# Patient Record
Sex: Male | Born: 1945 | Race: White | Hispanic: No | Marital: Married | Smoking: Former smoker
Health system: Southern US, Community
[De-identification: ages and names within clinical notes are randomized; demographics above are authoritative.]

## PROBLEM LIST (undated history)

## (undated) DIAGNOSIS — I639 Cerebral infarction, unspecified: Secondary | ICD-10-CM

## (undated) DIAGNOSIS — E079 Disorder of thyroid, unspecified: Secondary | ICD-10-CM

## (undated) DIAGNOSIS — E785 Hyperlipidemia, unspecified: Secondary | ICD-10-CM

## (undated) DIAGNOSIS — J439 Emphysema, unspecified: Secondary | ICD-10-CM

## (undated) DIAGNOSIS — I10 Essential (primary) hypertension: Secondary | ICD-10-CM

## (undated) HISTORY — DX: Cerebral infarction, unspecified: I63.9

## (undated) HISTORY — DX: Hyperlipidemia, unspecified: E78.5

## (undated) HISTORY — PX: CAROTID ENDARTERECTOMY: SUR193

---

## 2018-10-27 ENCOUNTER — Emergency Department (HOSPITAL_BASED_OUTPATIENT_CLINIC_OR_DEPARTMENT_OTHER): Payer: Medicare Other

## 2018-10-27 ENCOUNTER — Encounter (HOSPITAL_BASED_OUTPATIENT_CLINIC_OR_DEPARTMENT_OTHER): Payer: Self-pay | Admitting: Emergency Medicine

## 2018-10-27 ENCOUNTER — Emergency Department (HOSPITAL_BASED_OUTPATIENT_CLINIC_OR_DEPARTMENT_OTHER)
Admission: EM | Admit: 2018-10-27 | Discharge: 2018-10-27 | Disposition: A | Discharge status: Home / Self Care | Payer: Medicare Other | Attending: Emergency Medicine | Admitting: Emergency Medicine

## 2018-10-27 ENCOUNTER — Other Ambulatory Visit: Payer: Self-pay

## 2018-10-27 DIAGNOSIS — J439 Emphysema, unspecified: Secondary | ICD-10-CM | POA: Insufficient documentation

## 2018-10-27 DIAGNOSIS — Z87891 Personal history of nicotine dependence: Secondary | ICD-10-CM | POA: Insufficient documentation

## 2018-10-27 DIAGNOSIS — I10 Essential (primary) hypertension: Secondary | ICD-10-CM | POA: Insufficient documentation

## 2018-10-27 DIAGNOSIS — R0602 Shortness of breath: Secondary | ICD-10-CM | POA: Diagnosis present

## 2018-10-27 DIAGNOSIS — R05 Cough: Secondary | ICD-10-CM | POA: Insufficient documentation

## 2018-10-27 DIAGNOSIS — Z79899 Other long term (current) drug therapy: Secondary | ICD-10-CM | POA: Diagnosis not present

## 2018-10-27 DIAGNOSIS — R059 Cough, unspecified: Secondary | ICD-10-CM

## 2018-10-27 HISTORY — DX: Disorder of thyroid, unspecified: E07.9

## 2018-10-27 HISTORY — DX: Emphysema, unspecified: J43.9

## 2018-10-27 HISTORY — DX: Essential (primary) hypertension: I10

## 2018-10-27 LAB — COMPREHENSIVE METABOLIC PANEL
ALT: 32 U/L (ref 0–44)
AST: 47 U/L — ABNORMAL HIGH (ref 15–41)
Albumin: 3.9 g/dL (ref 3.5–5.0)
Alkaline Phosphatase: 68 U/L (ref 38–126)
Anion gap: 9 (ref 5–15)
BUN: 18 mg/dL (ref 8–23)
CO2: 26 mmol/L (ref 22–32)
Calcium: 8 mg/dL — ABNORMAL LOW (ref 8.9–10.3)
Chloride: 100 mmol/L (ref 98–111)
Creatinine, Ser: 1.37 mg/dL — ABNORMAL HIGH (ref 0.61–1.24)
GFR calc Af Amer: 59 mL/min — ABNORMAL LOW (ref >60)
GFR calc non Af Amer: 51 mL/min — ABNORMAL LOW (ref >60)
Glucose, Bld: 124 mg/dL — ABNORMAL HIGH (ref 70–99)
Potassium: 4.2 mmol/L (ref 3.5–5.1)
Sodium: 135 mmol/L (ref 135–145)
Total Bilirubin: 0.6 mg/dL (ref 0.3–1.2)
Total Protein: 7.2 g/dL (ref 6.5–8.1)

## 2018-10-27 LAB — CBC
HCT: 40.9 % (ref 39.0–52.0)
Hemoglobin: 13.6 g/dL (ref 13.0–17.0)
MCH: 32.4 pg (ref 26.0–34.0)
MCHC: 33.3 g/dL (ref 30.0–36.0)
MCV: 97.4 fL (ref 80.0–100.0)
Platelets: 138 10*3/uL — ABNORMAL LOW (ref 150–400)
RBC: 4.2 MIL/uL — ABNORMAL LOW (ref 4.22–5.81)
RDW: 11.9 % (ref 11.5–15.5)
WBC: 5 10*3/uL (ref 4.0–10.5)
nRBC: 0 % (ref 0.0–0.2)

## 2018-10-27 LAB — TROPONIN I: Troponin I: <0.03 ng/mL (ref <0.03)

## 2018-10-27 MED ORDER — ALBUTEROL SULFATE HFA 108 (90 BASE) MCG/ACT IN AERS
2.0000 | INHALATION_SPRAY | RESPIRATORY_TRACT | Status: DC | PRN
  Administered 2018-10-27: 10:00:00 2 via RESPIRATORY_TRACT

## 2018-10-27 MED ORDER — ALBUTEROL SULFATE HFA 108 (90 BASE) MCG/ACT IN AERS
INHALATION_SPRAY | RESPIRATORY_TRACT | Status: AC
  Administered 2018-10-27: 10:00:00 2 via RESPIRATORY_TRACT
  Filled 2018-10-27: qty 6.7

## 2018-10-27 MED ORDER — SODIUM CHLORIDE 0.9 % IV BOLUS
500.0000 mL | Freq: Once | INTRAVENOUS | Status: AC
  Administered 2018-10-27: 500 mL via INTRAVENOUS

## 2018-10-27 NOTE — ED Triage Notes (Signed)
Per EMS patient got off cruise ship on Monday.  Began having cough on ship.  Febrile while on cruise.  Had evisit and given amoxicillin without relief in cough.  Increased shortness of breath as well as diarrhea.  Patient from home.

## 2018-10-27 NOTE — ED Provider Notes (Signed)
[facility] EMERGENCY DEPARTMENT Provider Note   CSN: [id number] Arrival date & time: 10/27/18  0719    History   Chief Complaint Chief Complaint  Patient presents with  . Shortness of Breath    HPI Alexander Stewart is a 73 y.o. male.     HPI Patient is a 73 year old male who recently came off a cruise ship on October 23, 2018.  At that point he been having cough for 5 days and since returning to the [place] has been self quarantining at home.  He has not been tested for COVID-19.  He reportedly has been having some worsening cough over the past week and was started on Augmentin after an ED visit 2 days ago.  He has had 2 days of Augmentin and his wife reports that his cough is significantly improving.  This morning his wife called EMS from the patient sat up in bed and began coughing and then had a syncopal episode.  She thinks he may have struck his head.  He is not on anticoagulants.  She reports he was slow to recover.  Currently patient is without headache or head injury.  No neck pain.  He is alert and oriented x3.  He has no significant complaints.  He denies significant shortness of breath.  He reports persistence of his cough.  He is unsure if he thinks his cough has improved over the past 2 days.  Patient reports no increasing shortness of breath at time of my evaluation.  No history of asthma.  He is on losartan.  He had decreased oral intake yesterday per his wife.  Patient reports no fevers at home.  Wife reports that he has had some subjective fevers at home.  Patient reports no use of inhalers at home.  Patient denies a history of emphysema and COPD however a diagnosis of emphysema lives in his chart.  No complaints of neck pain no weakness of his arms or legs.  Denies nausea vomiting.  No chest pain.  No abdominal pain.   Past Medical History:  Diagnosis Date  . Emphysema lung (HCC)   . Hypertension   . Thyroid disease     There are no active problems to  display for this patient.   History reviewed. No pertinent surgical history.      Home Medications    Prior to Admission medications   Medication Sig Start Date End Date Taking? Authorizing Provider  levothyroxine (SYNTHROID, LEVOTHROID) 25 MCG tablet Take 25 mcg by mouth daily before breakfast.   Yes [provider]  losartan (COZAAR) 25 MG tablet Take 25 mg by mouth daily.   Yes [provider]    Family History History reviewed. No pertinent family history.  Social History Social History   Tobacco Use  . Smoking status: Former Games developermoker  . Smokeless tobacco: Never Used  Substance Use Topics  . Alcohol use: Yes  . Drug use: Never     Allergies   Patient has no known allergies.   Review of Systems Review of Systems  All other systems reviewed and are negative.    Physical Exam Updated Vital Signs BP 123/82   Pulse 80   Temp 99.3 F (37.4 C) (Oral)   Resp (!) 21   Ht 5' 7.5" (1.715 m)   Wt 74.8 kg   SpO2 97%   BMI 25.46 kg/m   Physical Exam Vitals signs and nursing note reviewed.  Constitutional:      Appearance: He  is well-developed.  HENT:     Head: Normocephalic.  Neck:     Musculoskeletal: Normal range of motion.     Comments: Full range of motion of neck without limitations Cardiovascular:     Rate and Rhythm: Normal rate.  Pulmonary:     Effort: Pulmonary effort is normal.  Abdominal:     General: There is no distension.  Musculoskeletal: Normal range of motion.     Comments: Moves all 4 extremities equally  Neurological:     Mental Status: He is alert and oriented to person, place, and time.      ED Treatments / Results  Labs (all labs ordered are listed, but only abnormal results are displayed) Labs Reviewed  CBC - Abnormal; Notable for the following components:      Result Value   RBC 4.20 (*)    Platelets 138 (*)    All other components within normal limits  COMPREHENSIVE METABOLIC PANEL - Abnormal; Notable  for the following components:   Glucose, Bld 124 (*)    Creatinine, Ser 1.37 (*)    Calcium 8.0 (*)    AST 47 (*)    GFR calc non Af Amer 51 (*)    GFR calc Af Amer 59 (*)    All other components within normal limits  TROPONIN I    EKG EKG Interpretation  Date/Time:  Friday October 27 2018 07:41:26 EDT Ventricular Rate:  73 PR Interval:    QRS Duration: 62 QT Interval:  338 QTC Calculation: 373 R Axis:   69 Text Interpretation:  Sinus rhythm No old tracing to compare Confirmed by Azalia Bilis ([id number]) on 10/27/2018 9:27:33 AM   Radiology Dg Chest Portable 1 View  Result Date: 10/27/2018 CLINICAL DATA:  Cough and shortness of breath. Returned from a cruise on Monday. EXAM: PORTABLE CHEST 1 VIEW COMPARISON:  None. FINDINGS: The heart size and mediastinal contours are within normal limits. Loop recorder in place. Atherosclerotic calcification of the aortic arch. Normal pulmonary vascularity. No focal consolidation, pleural effusion, or pneumothorax. No acute osseous abnormality. IMPRESSION: 1. No active disease. 2.  Aortic atherosclerosis (ICD10-I70.0). Electronically Signed   By: Obie Dredge M.D.   On: 10/27/2018 08:26    Procedures Procedures (including critical care time)  Medications Ordered in ED Medications  albuterol (PROVENTIL HFA;VENTOLIN HFA) 108 (90 Base) MCG/ACT inhaler 2 puff (has no administration in time range)  sodium chloride 0.9 % bolus 500 mL (has no administration in time range)     Initial Impression / Assessment and Plan / ED Course  I have reviewed the triage vital signs and the nursing notes.  Pertinent labs & imaging results that were available during my care of the patient were reviewed by me and considered in my medical decision making (see chart for details).        Definitely the possibility of COVID-19.  His chest x-ray is clear.  Oral temperature of 99.3.  No hypoxia or increased work of breathing here in the emergency department.  Patient  will need to continue to self quarantine at home.  Suspect mild dehydration.  500 cc bolus of fluid given.  He has been on Augmentin for 2 days and sounds like he is improving from a coughing standpoint.  Patient will be discharged home with an albuterol inhaler for cough.  I do not think he needs additional work-up or evaluation at this time.  He complains of no headache or head injury.  He has no neck pain  or neck stiffness.  He has full range of motion of his neck.  He is not on anticoagulants.  I do not think he needs CT imaging of his head at this time.  Head injury warnings are given.  Syncope in the setting of cough likely cough related syncope.  Dead doubt arrhythmia.  No palpitations.  No chest pain.    Final Clinical Impressions(s) / ED Diagnoses   Final diagnoses:  None    ED Discharge Orders    None       Azalia Bilis, MD 10/27/18 [phone]

## 2018-10-27 NOTE — Discharge Instructions (Addendum)
Use the albuterol inhaler every 4 hours as needed for cough  Continue the use of the antibiotics prescribed by your provider (Augmentin)  Return to the emergency department for any new or worsening symptoms  Please continue to self quarantine at home until you are fever free without the use of fever reducing medications for 72 hours and for greater than 7 days after initial onset of symptoms

## 2018-10-27 NOTE — ED Triage Notes (Addendum)
Patient reports cough x 1 week.  Reports seen with evisit and self quarantined at home. Taking augmentin x 2 days. Increased shortness of breath. Reports diarrhea after beginning abx.  Ambulatory to room in NAD.

## 2019-09-09 ENCOUNTER — Ambulatory Visit: Payer: Medicare Other

## 2020-10-15 IMAGING — DX PORTABLE CHEST - 1 VIEW
1 series · 1 of 1 positions shown · non-contrast
Comparison: None.

CLINICAL DATA: Cough and shortness of breath. Returned from [place] on [REDACTED].

EXAM:
PORTABLE CHEST 1 VIEW

[chest ap]
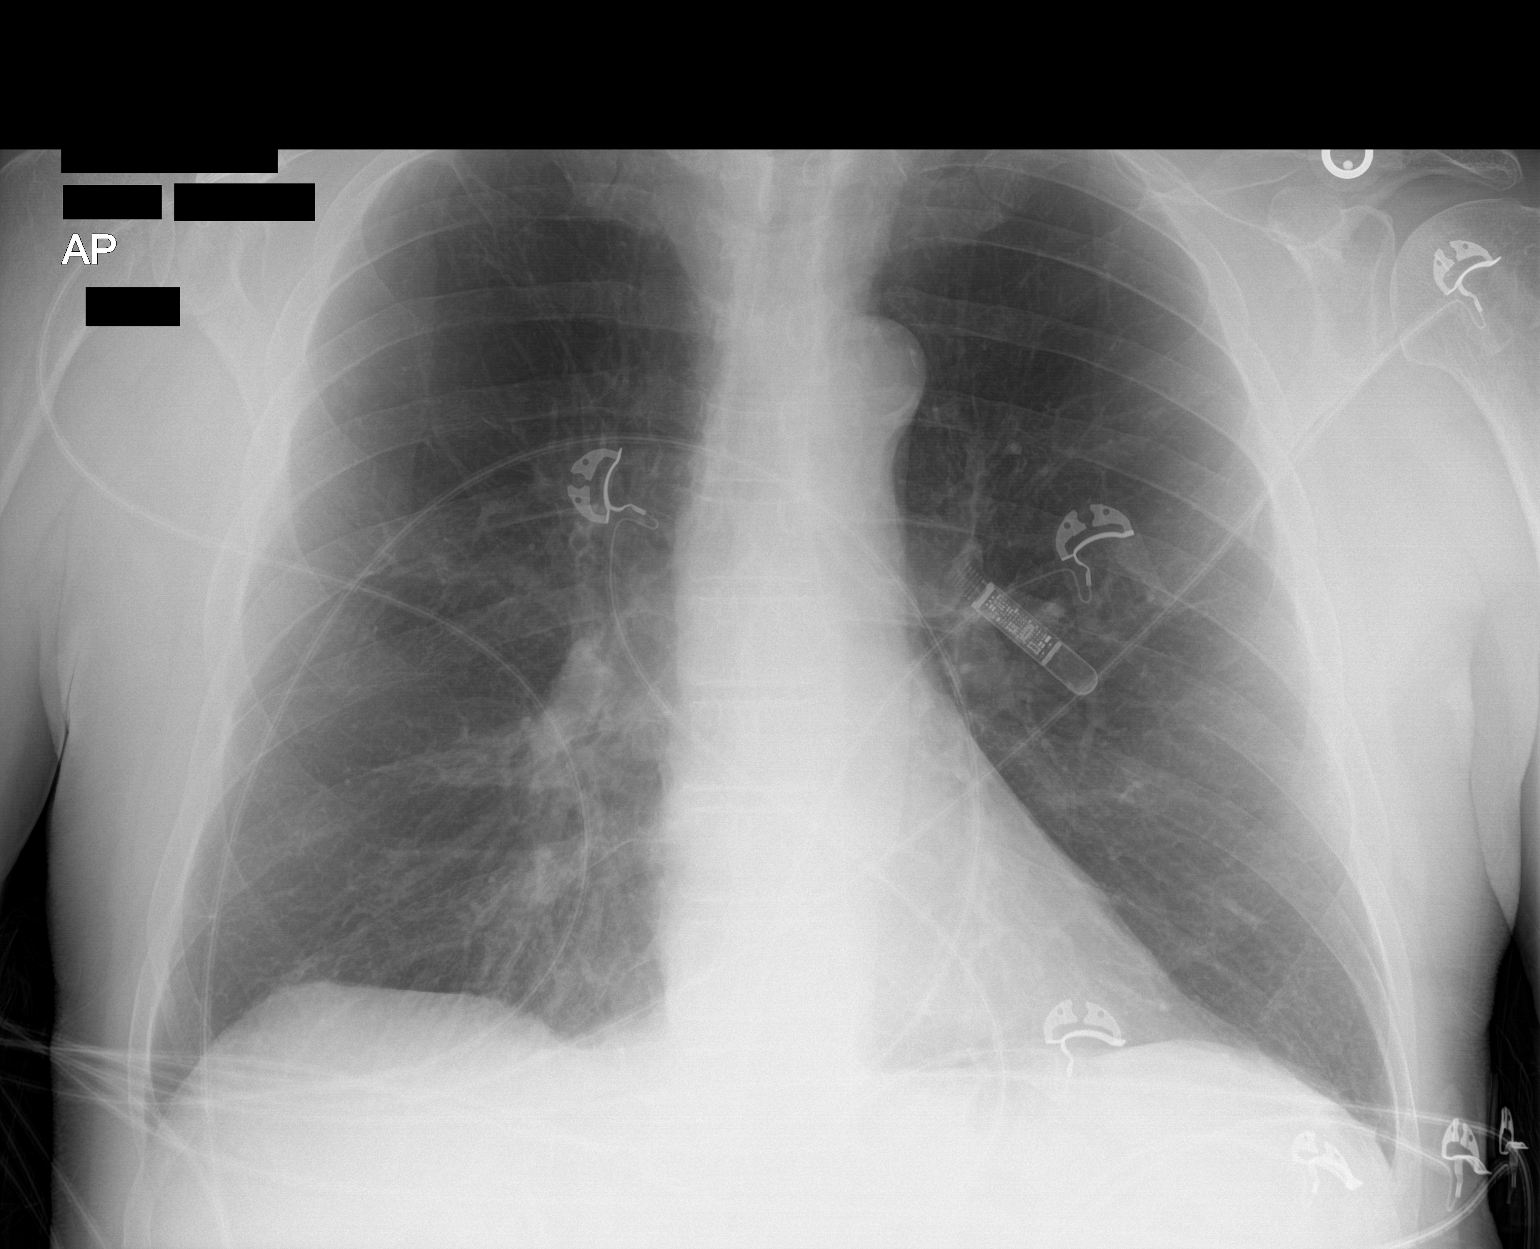

[1 of 1 positions shown; findings below may reference images not displayed]

FINDINGS: The heart size and mediastinal contours are within normal limits.
Loop recorder in place. Atherosclerotic calcification of the aortic
arch. Normal pulmonary vascularity. No focal consolidation, pleural
effusion, or pneumothorax. No acute osseous abnormality.
IMPRESSION: 1. No active disease.
2.  Aortic atherosclerosis ([id number]).

## 2022-04-12 ENCOUNTER — Emergency Department (HOSPITAL_COMMUNITY): Payer: Medicare Other

## 2022-04-12 ENCOUNTER — Encounter (HOSPITAL_COMMUNITY): Payer: Self-pay

## 2022-04-12 ENCOUNTER — Emergency Department (HOSPITAL_COMMUNITY): Payer: Medicare Other | Admitting: Certified Registered Nurse Anesthetist

## 2022-04-12 ENCOUNTER — Inpatient Hospital Stay (HOSPITAL_COMMUNITY)
Admission: EM | Admit: 2022-04-12 | Discharge: 2022-04-14 | DRG: 024 | Disposition: A | Discharge status: Home / Self Care | Payer: Medicare Other | Attending: Neurology | Admitting: Neurology

## 2022-04-12 ENCOUNTER — Encounter (HOSPITAL_COMMUNITY)
Admission: EM | Disposition: A | Discharge status: Home / Self Care | Payer: Self-pay | Source: Home / Self Care | Attending: Neurology

## 2022-04-12 ENCOUNTER — Inpatient Hospital Stay (HOSPITAL_COMMUNITY): Payer: Medicare Other

## 2022-04-12 ENCOUNTER — Other Ambulatory Visit: Payer: Self-pay

## 2022-04-12 DIAGNOSIS — E7800 Pure hypercholesterolemia, unspecified: Secondary | ICD-10-CM | POA: Diagnosis not present

## 2022-04-12 DIAGNOSIS — J449 Chronic obstructive pulmonary disease, unspecified: Secondary | ICD-10-CM

## 2022-04-12 DIAGNOSIS — I10 Essential (primary) hypertension: Secondary | ICD-10-CM

## 2022-04-12 DIAGNOSIS — I63511 Cerebral infarction due to unspecified occlusion or stenosis of right middle cerebral artery: Principal | ICD-10-CM | POA: Diagnosis present

## 2022-04-12 DIAGNOSIS — Y838 Other surgical procedures as the cause of abnormal reaction of the patient, or of later complication, without mention of misadventure at the time of the procedure: Secondary | ICD-10-CM | POA: Diagnosis not present

## 2022-04-12 DIAGNOSIS — E039 Hypothyroidism, unspecified: Secondary | ICD-10-CM | POA: Diagnosis present

## 2022-04-12 DIAGNOSIS — I63231 Cerebral infarction due to unspecified occlusion or stenosis of right carotid arteries: Secondary | ICD-10-CM

## 2022-04-12 DIAGNOSIS — G8194 Hemiplegia, unspecified affecting left nondominant side: Secondary | ICD-10-CM | POA: Diagnosis present

## 2022-04-12 DIAGNOSIS — R29710 NIHSS score 10: Secondary | ICD-10-CM | POA: Diagnosis present

## 2022-04-12 DIAGNOSIS — R251 Tremor, unspecified: Secondary | ICD-10-CM | POA: Diagnosis not present

## 2022-04-12 DIAGNOSIS — J439 Emphysema, unspecified: Secondary | ICD-10-CM | POA: Diagnosis present

## 2022-04-12 DIAGNOSIS — I6340 Cerebral infarction due to embolism of unspecified cerebral artery: Secondary | ICD-10-CM | POA: Diagnosis not present

## 2022-04-12 DIAGNOSIS — Z9181 History of falling: Secondary | ICD-10-CM | POA: Diagnosis not present

## 2022-04-12 DIAGNOSIS — I700 Atherosclerosis of aorta: Secondary | ICD-10-CM | POA: Diagnosis present

## 2022-04-12 DIAGNOSIS — G9751 Postprocedural hemorrhage and hematoma of a nervous system organ or structure following a nervous system procedure: Secondary | ICD-10-CM | POA: Diagnosis not present

## 2022-04-12 DIAGNOSIS — H5340 Unspecified visual field defects: Secondary | ICD-10-CM | POA: Diagnosis present

## 2022-04-12 DIAGNOSIS — Z87891 Personal history of nicotine dependence: Secondary | ICD-10-CM | POA: Diagnosis not present

## 2022-04-12 DIAGNOSIS — R29810 Facial weakness: Secondary | ICD-10-CM | POA: Diagnosis present

## 2022-04-12 DIAGNOSIS — Z8673 Personal history of transient ischemic attack (TIA), and cerebral infarction without residual deficits: Secondary | ICD-10-CM | POA: Diagnosis not present

## 2022-04-12 DIAGNOSIS — E785 Hyperlipidemia, unspecified: Secondary | ICD-10-CM | POA: Diagnosis present

## 2022-04-12 DIAGNOSIS — E079 Disorder of thyroid, unspecified: Secondary | ICD-10-CM | POA: Diagnosis present

## 2022-04-12 DIAGNOSIS — I639 Cerebral infarction, unspecified: Secondary | ICD-10-CM | POA: Diagnosis present

## 2022-04-12 DIAGNOSIS — I6521 Occlusion and stenosis of right carotid artery: Secondary | ICD-10-CM | POA: Diagnosis present

## 2022-04-12 DIAGNOSIS — I6389 Other cerebral infarction: Secondary | ICD-10-CM

## 2022-04-12 DIAGNOSIS — Z8679 Personal history of other diseases of the circulatory system: Secondary | ICD-10-CM | POA: Diagnosis not present

## 2022-04-12 HISTORY — PX: RADIOLOGY WITH ANESTHESIA: SHX6223

## 2022-04-12 HISTORY — PX: IR PERCUTANEOUS ART THROMBECTOMY/INFUSION INTRACRANIAL INC DIAG ANGIO: IMG6087

## 2022-04-12 HISTORY — PX: IR INTRA CRAN STENT: IMG2345

## 2022-04-12 HISTORY — PX: IR CT HEAD LTD: IMG2386

## 2022-04-12 HISTORY — PX: IR US GUIDE VASC ACCESS RIGHT: IMG2390

## 2022-04-12 HISTORY — PX: IR INTRAVSC STENT CERV CAROTID W/EMB-PROT MOD SED INCL ANGIO: IMG2303

## 2022-04-12 LAB — CBC
HCT: 43.1 % (ref 39.0–52.0)
Hemoglobin: 15.3 g/dL (ref 13.0–17.0)
MCH: 33.9 pg (ref 26.0–34.0)
MCHC: 35.5 g/dL (ref 30.0–36.0)
MCV: 95.6 fL (ref 80.0–100.0)
Platelets: 200 10*3/uL (ref 150–400)
RBC: 4.51 MIL/uL (ref 4.22–5.81)
RDW: 11.8 % (ref 11.5–15.5)
WBC: 6.1 10*3/uL (ref 4.0–10.5)
nRBC: 0 % (ref 0.0–0.2)

## 2022-04-12 LAB — I-STAT CHEM 8, ED
BUN: 11 mg/dL (ref 8–23)
Calcium, Ion: 1.04 mmol/L — ABNORMAL LOW (ref 1.15–1.40)
Chloride: 105 mmol/L (ref 98–111)
Creatinine, Ser: 1 mg/dL (ref 0.61–1.24)
Glucose, Bld: 174 mg/dL — ABNORMAL HIGH (ref 70–99)
HCT: 43 % (ref 39.0–52.0)
Hemoglobin: 14.6 g/dL (ref 13.0–17.0)
Potassium: 4 mmol/L (ref 3.5–5.1)
Sodium: 139 mmol/L (ref 135–145)
TCO2: 23 mmol/L (ref 22–32)

## 2022-04-12 LAB — ECHOCARDIOGRAM COMPLETE
AR max vel: 2.74 cm2
AV Area VTI: 2.43 cm2
AV Area mean vel: 2.25 cm2
AV Mean grad: 3 mmHg
AV Peak grad: 4.2 mmHg
Ao pk vel: 1.03 m/s
Area-P 1/2: 3.77 cm2
Calc EF: 60.9 %
Height: 67.5 in
S' Lateral: 2.3 cm
Single Plane A2C EF: 59.2 %
Single Plane A4C EF: 64.5 %
Weight: 2560.86 oz

## 2022-04-12 LAB — DIFFERENTIAL
Abs Immature Granulocytes: 0.01 10*3/uL (ref 0.00–0.07)
Basophils Absolute: 0.1 10*3/uL (ref 0.0–0.1)
Basophils Relative: 1 %
Eosinophils Absolute: 0 10*3/uL (ref 0.0–0.5)
Eosinophils Relative: 1 %
Immature Granulocytes: 0 %
Lymphocytes Relative: 41 %
Lymphs Abs: 2.5 10*3/uL (ref 0.7–4.0)
Monocytes Absolute: 0.5 10*3/uL (ref 0.1–1.0)
Monocytes Relative: 8 %
Neutro Abs: 3 10*3/uL (ref 1.7–7.7)
Neutrophils Relative %: 49 %

## 2022-04-12 LAB — COMPREHENSIVE METABOLIC PANEL
ALT: 23 U/L (ref 0–44)
AST: 29 U/L (ref 15–41)
Albumin: 3.9 g/dL (ref 3.5–5.0)
Alkaline Phosphatase: 69 U/L (ref 38–126)
Anion gap: 10 (ref 5–15)
BUN: 12 mg/dL (ref 8–23)
CO2: 25 mmol/L (ref 22–32)
Calcium: 9.2 mg/dL (ref 8.9–10.3)
Chloride: 105 mmol/L (ref 98–111)
Creatinine, Ser: 1.22 mg/dL (ref 0.61–1.24)
GFR, Estimated: >60 mL/min (ref >60)
Glucose, Bld: 174 mg/dL — ABNORMAL HIGH (ref 70–99)
Potassium: 4.2 mmol/L (ref 3.5–5.1)
Sodium: 140 mmol/L (ref 135–145)
Total Bilirubin: 0.8 mg/dL (ref 0.3–1.2)
Total Protein: 6.9 g/dL (ref 6.5–8.1)

## 2022-04-12 LAB — TSH: TSH: 10.849 u[IU]/mL — ABNORMAL HIGH (ref 0.350–4.500)

## 2022-04-12 LAB — HEMOGLOBIN A1C
Hgb A1c MFr Bld: 5.6 % (ref 4.8–5.6)
Mean Plasma Glucose: 114.02 mg/dL

## 2022-04-12 LAB — CBG MONITORING, ED: Glucose-Capillary: 140 mg/dL — ABNORMAL HIGH (ref 70–99)

## 2022-04-12 LAB — ETHANOL: Alcohol, Ethyl (B): <10 mg/dL (ref <10)

## 2022-04-12 LAB — MRSA NEXT GEN BY PCR, NASAL: MRSA by PCR Next Gen: NOT DETECTED

## 2022-04-12 LAB — T4, FREE: Free T4: 0.94 ng/dL (ref 0.61–1.12)

## 2022-04-12 LAB — APTT: aPTT: 28 seconds (ref 24–36)

## 2022-04-12 LAB — PROTIME-INR
INR: 1 (ref 0.8–1.2)
Prothrombin Time: 13.5 seconds (ref 11.4–15.2)

## 2022-04-12 SURGERY — IR WITH ANESTHESIA
Anesthesia: General

## 2022-04-12 MED ORDER — ACETAMINOPHEN 325 MG PO TABS: 650.0000 mg | ORAL_TABLET | ORAL | Status: DC | PRN

## 2022-04-12 MED ORDER — ACETAMINOPHEN 650 MG RE SUPP: 650.0000 mg | RECTAL | Status: DC | PRN

## 2022-04-12 MED ORDER — EPHEDRINE SULFATE-NACL 50-0.9 MG/10ML-% IV SOSY
PREFILLED_SYRINGE | INTRAVENOUS | Status: DC | PRN
  Administered 2022-04-12 (×2): 5 mg via INTRAVENOUS

## 2022-04-12 MED ORDER — GLYCOPYRROLATE PF 0.2 MG/ML IJ SOSY
PREFILLED_SYRINGE | INTRAMUSCULAR | Status: DC | PRN
  Administered 2022-04-12: .2 mg via INTRAVENOUS

## 2022-04-12 MED ORDER — FENTANYL CITRATE (PF) 100 MCG/2ML IJ SOLN
INTRAMUSCULAR | Status: AC
  Filled 2022-04-12: qty 2

## 2022-04-12 MED ORDER — LEVOTHYROXINE SODIUM 25 MCG PO TABS
25.0000 ug | ORAL_TABLET | Freq: Every day | ORAL | Status: DC
  Administered 2022-04-13 – 2022-04-14 (×2): 25 ug via ORAL
  Filled 2022-04-12 (×2): qty 1

## 2022-04-12 MED ORDER — CANGRELOR TETRASODIUM 50 MG IV SOLR
INTRAVENOUS | Status: AC
  Filled 2022-04-12: qty 50

## 2022-04-12 MED ORDER — SUGAMMADEX SODIUM 200 MG/2ML IV SOLN
INTRAVENOUS | Status: DC | PRN
  Administered 2022-04-12: 400 mg via INTRAVENOUS

## 2022-04-12 MED ORDER — LIDOCAINE 2% (20 MG/ML) 5 ML SYRINGE
INTRAMUSCULAR | Status: DC | PRN
  Administered 2022-04-12: 80 mg via INTRAVENOUS

## 2022-04-12 MED ORDER — IOHEXOL 300 MG/ML  SOLN
100.0000 mL | Freq: Once | INTRAMUSCULAR | Status: AC | PRN
  Administered 2022-04-12: 50 mL via INTRA_ARTERIAL

## 2022-04-12 MED ORDER — LOSARTAN POTASSIUM 50 MG PO TABS: 25.0000 mg | ORAL_TABLET | Freq: Every day | ORAL | Status: DC

## 2022-04-12 MED ORDER — ORAL CARE MOUTH RINSE: 15.0000 mL | OROMUCOSAL | Status: DC | PRN

## 2022-04-12 MED ORDER — CLEVIDIPINE BUTYRATE 0.5 MG/ML IV EMUL: 0.0000 mg/h | INTRAVENOUS | Status: DC

## 2022-04-12 MED ORDER — SODIUM CHLORIDE 0.9% FLUSH: 3.0000 mL | Freq: Once | INTRAVENOUS | Status: DC

## 2022-04-12 MED ORDER — FENTANYL CITRATE (PF) 250 MCG/5ML IJ SOLN
INTRAMUSCULAR | Status: DC | PRN
  Administered 2022-04-12 (×2): 25 ug via INTRAVENOUS
  Administered 2022-04-12: 50 ug via INTRAVENOUS

## 2022-04-12 MED ORDER — CHLORHEXIDINE GLUCONATE CLOTH 2 % EX PADS
6.0000 | MEDICATED_PAD | Freq: Every day | CUTANEOUS | Status: DC
  Administered 2022-04-12 – 2022-04-13 (×2): 6 via TOPICAL

## 2022-04-12 MED ORDER — PHENYLEPHRINE 80 MCG/ML (10ML) SYRINGE FOR IV PUSH (FOR BLOOD PRESSURE SUPPORT)
PREFILLED_SYRINGE | INTRAVENOUS | Status: DC | PRN
  Administered 2022-04-12 (×2): 80 ug via INTRAVENOUS

## 2022-04-12 MED ORDER — ROCURONIUM BROMIDE 10 MG/ML (PF) SYRINGE
PREFILLED_SYRINGE | INTRAVENOUS | Status: DC | PRN
  Administered 2022-04-12: 50 mg via INTRAVENOUS
  Administered 2022-04-12 (×2): 10 mg via INTRAVENOUS

## 2022-04-12 MED ORDER — PHENYLEPHRINE HCL-NACL 20-0.9 MG/250ML-% IV SOLN
INTRAVENOUS | Status: DC | PRN
  Administered 2022-04-12: 20 ug/min via INTRAVENOUS

## 2022-04-12 MED ORDER — IOHEXOL 300 MG/ML  SOLN
100.0000 mL | Freq: Once | INTRAMUSCULAR | Status: AC | PRN
  Administered 2022-04-12: 75 mL via INTRA_ARTERIAL

## 2022-04-12 MED ORDER — DEXAMETHASONE SODIUM PHOSPHATE 10 MG/ML IJ SOLN
INTRAMUSCULAR | Status: DC | PRN
  Administered 2022-04-12: 5 mg via INTRAVENOUS

## 2022-04-12 MED ORDER — STROKE: EARLY STAGES OF RECOVERY BOOK
Freq: Once | Status: AC
  Filled 2022-04-12: qty 1

## 2022-04-12 MED ORDER — CANGRELOR BOLUS VIA INFUSION
30.0000 ug/kg | Freq: Once | INTRAVENOUS | Status: AC
  Administered 2022-04-12: 2178 ug via INTRAVENOUS

## 2022-04-12 MED ORDER — SENNOSIDES-DOCUSATE SODIUM 8.6-50 MG PO TABS: 1.0000 | ORAL_TABLET | Freq: Every evening | ORAL | Status: DC | PRN

## 2022-04-12 MED ORDER — ASPIRIN 81 MG PO TBEC
81.0000 mg | DELAYED_RELEASE_TABLET | Freq: Once | ORAL | Status: AC
  Administered 2022-04-12: 81 mg via ORAL
  Filled 2022-04-12: qty 1

## 2022-04-12 MED ORDER — CLEVIDIPINE BUTYRATE 0.5 MG/ML IV EMUL
0.0000 mg/h | INTRAVENOUS | Status: DC
  Filled 2022-04-12: qty 50

## 2022-04-12 MED ORDER — TICAGRELOR 90 MG PO TABS
180.0000 mg | ORAL_TABLET | Freq: Once | ORAL | Status: AC
  Administered 2022-04-12: 180 mg via ORAL
  Filled 2022-04-12: qty 2

## 2022-04-12 MED ORDER — PANTOPRAZOLE SODIUM 40 MG IV SOLR: 40.0000 mg | Freq: Every day | INTRAVENOUS | Status: DC

## 2022-04-12 MED ORDER — PROPOFOL 10 MG/ML IV BOLUS
INTRAVENOUS | Status: DC | PRN
  Administered 2022-04-12: 120 mg via INTRAVENOUS
  Administered 2022-04-12: 50 mg via INTRAVENOUS

## 2022-04-12 MED ORDER — LACTATED RINGERS IV SOLN: INTRAVENOUS | Status: DC | PRN

## 2022-04-12 MED ORDER — SUCCINYLCHOLINE CHLORIDE 200 MG/10ML IV SOSY
PREFILLED_SYRINGE | INTRAVENOUS | Status: DC | PRN
  Administered 2022-04-12: 100 mg via INTRAVENOUS

## 2022-04-12 MED ORDER — ONDANSETRON HCL 4 MG/2ML IJ SOLN
INTRAMUSCULAR | Status: DC | PRN
  Administered 2022-04-12: 4 mg via INTRAVENOUS

## 2022-04-12 MED ORDER — ONDANSETRON HCL 4 MG/2ML IJ SOLN: 4.0000 mg | Freq: Four times a day (QID) | INTRAMUSCULAR | Status: DC | PRN

## 2022-04-12 MED ORDER — ASPIRIN 81 MG PO TBEC
81.0000 mg | DELAYED_RELEASE_TABLET | Freq: Every day | ORAL | Status: DC
  Administered 2022-04-13 – 2022-04-14 (×2): 81 mg via ORAL
  Filled 2022-04-12 (×2): qty 1

## 2022-04-12 MED ORDER — TICAGRELOR 90 MG PO TABS
90.0000 mg | ORAL_TABLET | Freq: Two times a day (BID) | ORAL | Status: DC
  Administered 2022-04-13 – 2022-04-14 (×3): 90 mg via ORAL
  Filled 2022-04-12 (×3): qty 1

## 2022-04-12 MED ORDER — ACETAMINOPHEN 160 MG/5ML PO SOLN: 650.0000 mg | ORAL | Status: DC | PRN

## 2022-04-12 MED ORDER — TENECTEPLASE FOR STROKE
0.2500 mg/kg | PACK | Freq: Once | INTRAVENOUS | Status: AC
  Administered 2022-04-12: 18 mg via INTRAVENOUS
  Filled 2022-04-12: qty 10

## 2022-04-12 MED ORDER — PHENYLEPHRINE HCL-NACL 20-0.9 MG/250ML-% IV SOLN: 0.0000 ug/min | INTRAVENOUS | Status: DC

## 2022-04-12 MED ORDER — IOHEXOL 350 MG/ML SOLN
80.0000 mL | Freq: Once | INTRAVENOUS | Status: AC | PRN
  Administered 2022-04-12: 80 mL via INTRAVENOUS

## 2022-04-12 MED ORDER — SODIUM CHLORIDE 0.9 % IV SOLN
2.0000 ug/kg/min | INTRAVENOUS | Status: AC
  Administered 2022-04-12 (×2): 2 ug/kg/min via INTRAVENOUS
  Filled 2022-04-12 (×2): qty 50

## 2022-04-12 MED ORDER — PANTOPRAZOLE SODIUM 40 MG PO TBEC
40.0000 mg | DELAYED_RELEASE_TABLET | Freq: Every evening | ORAL | Status: DC
  Administered 2022-04-12 – 2022-04-13 (×2): 40 mg via ORAL
  Filled 2022-04-12 (×2): qty 1

## 2022-04-12 MED ORDER — SODIUM CHLORIDE 0.9 % IV SOLN: INTRAVENOUS | Status: DC

## 2022-04-12 NOTE — Anesthesia Procedure Notes (Addendum)
Procedure Name: Intubation Date/Time: 04/12/2022 8:58 AM  Performed by: Lorie Phenix, CRNAPre-anesthesia Checklist: Patient identified, Emergency Drugs available, Suction available and Patient being monitored Patient Re-evaluated:Patient Re-evaluated prior to induction Oxygen Delivery Method: Circle system utilized Preoxygenation: Pre-oxygenation with 100% oxygen Induction Type: IV induction and Rapid sequence Laryngoscope Size: Glidescope and 4 Grade View: Grade I Tube type: Oral Tube size: 7.0 mm Number of attempts: 1 Airway Equipment and Method: Stylet Placement Confirmation: ETT inserted through vocal cords under direct vision, positive ETCO2 and breath sounds checked- equal and bilateral Secured at: 24 cm Tube secured with: Tape Dental Injury: Teeth and Oropharynx as per pre-operative assessment  Comments: Pt with C-collar, not cleared. Elective video laryngoscope intubation, maintained C-spine neutral.

## 2022-04-12 NOTE — Anesthesia Preprocedure Evaluation (Signed)
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  Reproductive/Obstetrics                            Anesthesia Physical Anesthesia Plan  ASA: 4 and emergent  Anesthesia Plan: General   Post-op Pain Management:    Induction: Intravenous  PONV Risk Score and Plan: 2 and Dexamethasone and Ondansetron  Airway Management Planned: Oral ETT and Video Laryngoscope Planned  Additional Equipment:   Intra-op Plan:   Post-operative Plan: Extubation in OR  Informed Consent: I have reviewed the patients History and Physical, chart, labs and discussed the procedure including the risks, benefits and alternatives for the proposed anesthesia with the patient or authorized representative who has indicated his/her understanding and acceptance.     Dental advisory given and Only emergency history available  Plan Discussed with: CRNA  Anesthesia Plan Comments: (Pre-op eval completed after  induction due to emergent nature of the procedure.)        Anesthesia Quick Evaluation

## 2022-04-12 NOTE — H&P (Addendum)
NEURO HOSPITALIST CONSULT NOTE   Requestig physician: Dr. Rubin Payor  Reason for Consult: Acute onset of left sided weakness  History obtained from:  Wife, EMS, Patient and Chart     HPI:                                                                                                                                          Alexander Stewart is an 76 y.o. 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 Wife found him awake on the floor in his bathroom without obvious signs of trauma. No seizure activity noted by family or EMS. Per EMS he was alert and oriented x 4. Fully functional at baseline with an mRS of 0.   Vitals per EMS: 164/80, HR 72, RR 14, 98% RA, CBG 128.  He has no prior history of stroke or MI. He is not on a blood thinner.   Past Medical History:  Diagnosis Date   Emphysema lung (HCC)    Hypertension    Thyroid disease     No past surgical history on file.  No family history on file.           Social History:  reports that he has quit smoking. He has never used smokeless tobacco. He reports current alcohol use. He reports that he does not use drugs.  No Known Allergies  HOME MEDICATIONS:                                                                                                                      No current facility-administered medications on file prior to encounter.   Current Outpatient Medications on File Prior to Encounter  Medication Sig Dispense Refill   levothyroxine (SYNTHROID, LEVOTHROID) 25 MCG tablet Take 25 mcg by mouth daily before breakfast.     losartan (COZAAR) 25 MG tablet Take 25 mg by mouth daily.       ROS:  The patient is unaware of his left sided deficits.  Denies any pain. Comprehensive ROS deferred due to acuity of presentation.    BP (!) 156/87   Ht 5' 7.5" (1.715 m)   Wt 72.6 kg   BMI 24.70 kg/m     General Examination:                                                                                                       Physical Exam  HEENT-  [place]/AT  Lungs- Respirations unlabored Extremities- No edema  Neurological Examination Mental Status: Awake and alert. Oriented to the day of the week, the month and year. Oriented to his city of residence and the state. Speech is dysarthric but fluent, with intact naming and comprehension. When his left hand is held up to his face he states that it belongs to the examiner.  Cranial Nerves: II: Left visual field cut. PERRL.   III,IV, VI: No ptosis. Eyes are conjugate. Visual pursuits to the right are intact. Has difficulty crossing the midline to the left. No nystagmus.  V: Decreased sensation on the left.  VII: Left facial droop VIII: Hearing intact to voice IX,X: Phonation intact XI: Head preferentially rotated to the right XII: Midline tongue extension Motor: RUE and RLE 5/5 LUE 4/5 with spastic movement LLE 4+/5 Sensory: Insensate to touch and pinch LUE. Can sense pinch to LLE but not FT and states that the side of the stimulus is on the right.  Right sided sensation is normal.  Deep Tendon Reflexes: 2+ and symmetric throughout Plantars: Right: downgoing   Left: downgoing Cerebellar: Ataxic LUE. Normal RUE.  Gait: Unable to assess   Lab Results: Basic Metabolic Panel: No results for input(s): "NA", "K", "CL", "CO2", "GLUCOSE", "BUN", "CREATININE", "CALCIUM", "MG", "PHOS" in the last 168 hours.  CBC: No results for input(s): "WBC", "NEUTROABS", "HGB", "HCT", "MCV", "PLT" in the last 168 hours.  Cardiac Enzymes: No results for input(s): "CKTOTAL", "CKMB", "CKMBINDEX", "TROPONINI" in the last 168 hours.  Lipid Panel: No results for input(s): "CHOL", "TRIG", "HDL", "CHOLHDL",  "VLDL", "LDLCALC" in the last 168 hours.  Imaging: No results found.   Assessment: 76 year old male presenting with acute onset of left sided weakness, left hemneglect, left facial droop and left visual field cut.  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 ASPECTS is 9. Asymmetric hyperdensity of the M1 right middle cerebral artery suspicious for the presence of endoluminal thrombus. Correlate with findings on pending CTA head/neck. Mild generalized parenchymal atrophy. - CTA neck: The right ICA becomes occluded just beyond its origin, and remains occluded throughout the remainder of the neck. Although the appearance is nonspecific, right ICA dissection is a consideration with plaque rupture also on the DDx. The origins of the innominate artery, left common carotid artery and left subclavian artery are excluded from the field of view; within the described limitations, there is no hemodynamically significant stenosis of the left common carotid or cervical internal carotid arteries. Vertebral arteries patent within the neck without stenosis.  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 Subocclusive thrombus is present within the distal cavernous segment. There is more robust reconstitution of right ICA enhancement at the paraclinoid and supraclinoid level. However, there is abrupt occlusion of the right middle cerebral artery at the proximal M1 segment level. 1-2 mm posteroinferiorly projecting vascular protrusion arising from the paraclinoid right ICA, which may reflect an aneurysm. - After comprehensive review of possible contraindications, he has no absolute contraindications to TNK administration. The patient is a TNK candidate. The patient's anosognosia precludes meaningful medical decision making on his part at this time. Family was not  available to obtain consent despite multiple attempts to reach by telephone. Overall benefits of TNK regarding long-term prognosis are felt to outweigh risks. On an emergent basis. Dr. Alvino Chapel and myself have discussed extensively the risks/benefits of TNK. The patient fits criteria for emergent two-physician decision-making regarding emergency treatment for his right ICA and MCA occlusion, which is potentially life-threatening due to the high risk for massive stroke with resultant malignant cerebral edema, in addition to a high risk for severe long-term morbidity if he survives without treatment.   - The patient is a VIR candidate. After TNK was administered, family arrived to the ED and were able to provide informed consent for VIR. Risks/benefits of the procedure were discussed extensively with patient's wife, including approximately 50% chance of significant improvement relative to an approximate 10% chance of subarachnoid hemorrhage with possibility of significant worsening including death. The patient's wife expressed understanding and provided informed consent to proceed with VIR. All questions answered. Consent form signed.  Recommendations: - Admitting to Neuro ICU.  - Post-TNK and VIR order set to include frequent neuro checks and BP management.  - No antiplatelet medications or anticoagulants for at least 24 hours following TNK.  - DVT prophylaxis with SCDs.  - Will need to be started on a statin. Will need baseline CK level drawn.  - Will need to be started on antiplatelet therapy if follow up CT at 24 hours is negative for hemorrhagic conversion. - TTE.  - MRI brain - PT/OT/Speech.  - NPO until passes swallow evaluation.  - Sliding scale insulin.  - Telemetry monitoring - Fasting lipid panel, HgbA1c - Continue levothyroxine for his hypothyroidism  90 minutes spent in the emergent neurological evaluation and management of this critically ill patient.   Addendum: - His med history  was updated by Pharmacy and apparently he is no longer taking synthroid. - Obtaining TSH level and free T4.  - Will continue synthroid for now. May discontinue later this admission if indicated based on further interview with family and based on TSH and T4 results.    Electronically signed: Dr. Kerney Elbe 04/12/2022, 8:25 AM

## 2022-04-12 NOTE — Sedation Documentation (Signed)
Patient transported to [facility] ICU with CRNA. TK RN at the bedside to receive patient. Bedside handoff given. Right groin assessed. Clean, dry and intact. No drainage noted from dressing. Soft to palpation, no hematoma noted. Dopper only PT pulse distally on the right. Absent pedal. Extremity warm to touch with intact cap refill.

## 2022-04-12 NOTE — Progress Notes (Addendum)
Referring Physician(s): Code stroke  Supervising Physician: Baldemar Lenis  Patient Status:  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   Subjective:  Pt resting in bed. He denies HA. He c/o tenderness to puncture site.   Allergies: Patient has no known allergies.  Medications: Prior to Admission medications   Medication Sig Start Date End Date Taking? Authorizing Provider  Cholecalciferol (VITAMIN D3 PO) Take 1 capsule by mouth daily.   Yes [provider]  lovastatin (MEVACOR) 10 MG tablet Take 20 mg by mouth daily. 02/15/22  Yes [provider]  metoprolol succinate (TOPROL-XL) 25 MG 24 hr tablet Take 25 mg by mouth daily. 04/09/22  Yes [provider]  Multiple Vitamin (MULTIVITAMIN WITH MINERALS) TABS tablet Take 1 tablet by mouth daily.   Yes [provider]  vitamin B-12 (CYANOCOBALAMIN) 100 MCG tablet Take 100 mcg by mouth daily.   Yes [provider]  vitamin E 1000 UNIT capsule Take 1,000 Units by mouth daily.   Yes [provider]     Vital Signs: BP 127/73   Pulse 88   Resp 12   Ht 5' 7.5" (1.715 m)   Wt 160 lb 0.9 oz (72.6 kg)   SpO2 92%   BMI 24.70 kg/m   Physical Exam Constitutional:      General: He is not in acute distress.    Appearance: Normal appearance. He is not ill-appearing.  HENT:     Head: Normocephalic and atraumatic.  Eyes:     Extraocular Movements: Extraocular movements intact.     Pupils: Pupils are equal, round, and reactive to light.  Pulmonary:     Effort: Pulmonary effort is normal. No respiratory distress.  Skin:    General: Skin is warm and dry.     Comments: R CFA access site is soft with no active bleeding and no appreciable pseudoaneurysm. Dried oozing to dressing. Mild ecchymosis to site.     Neurological:     Mental Status: He is alert and oriented to person,  place, and time.     Comments: Alert, aware and oriented X 3 Speech and comprehension is intact.  PERRL bilaterally Left side facial droop Tongue midline Can spontaneously move all 4 extremities.  Hand grip strength R - 5/5, L 4/5 Fine motor and coordination intact.  Distal pulses (DP's) palpable bilaterally.  Negative pronator drift. Gait not assessed Romberg not assessed Heel to toe not assessed Distal pulses not assessed    Psychiatric:        Mood and Affect: Mood normal.        Behavior: Behavior normal.        Thought Content: Thought content normal.        Judgment: Judgment normal.     Imaging: CT HEAD WO CONTRAST ( )  Result Date: 04/12/2022 CLINICAL DATA:  Status post mechanical thrombectomy, follow-up subarachnoid hemorrhage EXAM: CT HEAD WITHOUT CONTRAST TECHNIQUE: Contiguous axial images were obtained from the base of the skull through the vertex without intravenous contrast. RADIATION DOSE REDUCTION: This exam was performed according to the departmental dose-optimization program which includes automated exposure control, adjustment of the mA and/or kV according to patient size and/or use of iterative reconstruction technique. COMPARISON:  04/12/2022 CT head 11:21 a.m. and 04/12/2022 CT head 8:31 a.m. FINDINGS: Evaluation is somewhat limited by motion artifact. Brain: Subarachnoid hyperdense material in the right frontal, temporal, and  frontoparietal regions (series 7, images 7, 13, and 20, respectively), which appears similar in size and distribution to the CT done immediately post thrombectomy. Additional hyperdense material in the right inferior frontal lobe is also favored to be subarachnoid and appears less conspicuous than on the post thrombectomy CT. Previously noted loss of gray-white differentiation in the right insula is less conspicuous, possibly due to the hyperdense material. No additional acute infarct, mass, mass effect, or midline shift. No hydrocephalus.  Vascular: Residual contrast is noted in the blood vessels. No definite hyperdense vessel. Atherosclerotic calcifications in the intracranial carotid and vertebral arteries. Skull: Negative for fracture or focal lesion. Sinuses/Orbits: No acute finding. Other: The mastoids are well aerated. IMPRESSION: 1. Subarachnoid hyperdense material in the right cerebral hemisphere is favored to represent a small amount of subarachnoid hemorrhage, which appears similar to the immediate post thrombectomy CT. This could also represent contrast staining. 2. No additional acute intracranial process. Electronically Signed   By: Wiliam Ke M.D.   On: 04/12/2022 14:36   ECHOCARDIOGRAM COMPLETE  Result Date: 04/12/2022    ECHOCARDIOGRAM REPORT   Patient Name:   FLAVIUS REPSHER Date of Exam: 04/12/2022 Medical Rec #:  [id number]    Height:       67.5 in Accession #:    [id number]   Weight:       160.1 lb Date of Birth:  11-29-1945   BSA:          1.850 m Patient Age:    75 years     BP:           105/90 mmHg Patient Gender: M            HR:           76 bpm. Exam Location:  Inpatient Procedure: 2D Echo Indications:    stroke  History:        Patient has no prior history of Echocardiogram examinations.                 Stroke; Risk Factors:Hypertension, Former Smoker and emphysema.  Sonographer:    Cathie Hoops Referring Phys: [phone] ERIC LINDZEN  Sonographer Comments: Technically difficult study due to poor echo windows, suboptimal parasternal window, suboptimal apical window and suboptimal subcostal window. Image acquisition challenging due to uncooperative patient, Image acquisition challenging  due to respiratory motion and supine. IMPRESSIONS  1. Left ventricular ejection fraction, by estimation, is 60 to 65%. The left ventricle has normal function. The left ventricle has no regional wall motion abnormalities. Left ventricular diastolic parameters are consistent with Grade I diastolic dysfunction (impaired relaxation).  2. Right  ventricular systolic function is normal. The right ventricular size is normal. Tricuspid regurgitation signal is inadequate for assessing PA pressure.  3. The mitral valve is normal in structure. No evidence of mitral valve regurgitation. No evidence of mitral stenosis.  4. The aortic valve is normal in structure. Aortic valve regurgitation is not visualized. No aortic stenosis is present.  5. The inferior vena cava is normal in size with greater than 50% respiratory variability, suggesting right atrial pressure of 3 mmHg. Conclusion(s)/Recommendation(s): No intracardiac source of embolism detected on this transthoracic study. Consider a transesophageal echocardiogram to exclude cardiac source of embolism if clinically indicated. FINDINGS  Left Ventricle: Left ventricular ejection fraction, by estimation, is 60 to 65%. The left ventricle has normal function. The left ventricle has no regional wall motion abnormalities. The left ventricular internal cavity size was normal in size. There is  no left ventricular hypertrophy. Left ventricular diastolic parameters are consistent with Grade I diastolic dysfunction (impaired relaxation). Normal left ventricular filling pressure. Right Ventricle: The right ventricular size is normal. No increase in right ventricular wall thickness. Right ventricular systolic function is normal. Tricuspid regurgitation signal is inadequate for assessing PA pressure. Left Atrium: Left atrial size was normal in size. Right Atrium: Right atrial size was normal in size. Pericardium: There is no evidence of pericardial effusion. Mitral Valve: The mitral valve is normal in structure. No evidence of mitral valve regurgitation. No evidence of mitral valve stenosis. Tricuspid Valve: The tricuspid valve is normal in structure. Tricuspid valve regurgitation is not demonstrated. No evidence of tricuspid stenosis. Aortic Valve: The aortic valve is normal in structure. Aortic valve regurgitation is not  visualized. No aortic stenosis is present. Aortic valve mean gradient measures 3.0 mmHg. Aortic valve peak gradient measures 4.2 mmHg. Aortic valve area, by VTI measures 2.43 cm. Pulmonic Valve: The pulmonic valve was normal in structure. Pulmonic valve regurgitation is not visualized. No evidence of pulmonic stenosis. Aorta: The aortic root is normal in size and structure. Venous: The inferior vena cava is normal in size with greater than 50% respiratory variability, suggesting right atrial pressure of 3 mmHg. IAS/Shunts: The interatrial septum appears to be lipomatous. No atrial level shunt detected by color flow Doppler.  LEFT VENTRICLE PLAX 2D LVIDd:         3.10 cm     Diastology LVIDs:         2.30 cm     LV e' medial:    5.55 cm/s LV PW:         0.90 cm     LV E/e' medial:  8.6 LV IVS:        1.00 cm     LV e' lateral:   7.51 cm/s LVOT diam:     1.80 cm     LV E/e' lateral: 6.3 LV SV:         52 LV SV Index:   28 LVOT Area:     2.54 cm  LV Volumes (MOD) LV vol d, MOD A2C: 44.9 ml LV vol d, MOD A4C: 59.7 ml LV vol s, MOD A2C: 18.3 ml LV vol s, MOD A4C: 21.2 ml LV SV MOD A2C:     26.6 ml LV SV MOD A4C:     59.7 ml LV SV MOD BP:      31.5 ml RIGHT VENTRICLE RV S prime:     15.40 cm/s TAPSE (M-mode): 2.1 cm LEFT ATRIUM           Index LA Vol (A4C): 21.6 ml 11.68 ml/m  AORTIC VALVE AV Area (Vmax):    2.74 cm AV Area (Vmean):   2.25 cm AV Area (VTI):     2.43 cm AV Vmax:           103.00 cm/s AV Vmean:          81.300 cm/s AV VTI:            0.214 m AV Peak Grad:      4.2 mmHg AV Mean Grad:      3.0 mmHg LVOT Vmax:         111.00 cm/s LVOT Vmean:        71.800 cm/s LVOT VTI:          0.204 m LVOT/AV VTI ratio: 0.95  AORTA Ao Root diam: 3.10 cm MITRAL VALVE MV Area (PHT): 3.77 cm  SHUNTS MV Decel Time: 201 msec    Systemic VTI:  0.20 m MV E velocity: 47.60 cm/s  Systemic Diam: 1.80 cm MV A velocity: 96.40 cm/s MV E/A ratio:  0.49 Fransico Him MD Electronically signed by Fransico Him MD Signature Date/Time:  04/12/2022/1:46:08 PM    Final    CT ANGIO HEAD NECK W WO CM (CODE STROKE)  Result Date: 04/12/2022 CLINICAL DATA:  Neuro deficit, acute, stroke suspected. EXAM: CT ANGIOGRAPHY HEAD AND NECK TECHNIQUE: Multidetector CT imaging of the head and neck was performed using the standard protocol during bolus administration of intravenous contrast. Multiplanar CT image reconstructions and MIPs were obtained to evaluate the vascular anatomy. Carotid stenosis measurements (when applicable) are obtained utilizing NASCET criteria, using the distal internal carotid diameter as the denominator. RADIATION DOSE REDUCTION: This exam was performed according to the departmental dose-optimization program which includes automated exposure control, adjustment of the mA and/or kV according to patient size and/or use of iterative reconstruction technique. CONTRAST:  65mL OMNIPAQUE IOHEXOL 350 MG/ML SOLN COMPARISON:  Same day noncontrast head CT 04/12/2022. FINDINGS: CTA NECK FINDINGS Aortic arch: The origins of the innominate, left common carotid and left subclavian arteries are excluded from the field of view. Atherosclerotic plaque within the visualized aortic arch and proximal major branch vessels of the neck. Streak artifact from a dense right-sided contrast bolus partially obscures the right subclavian artery. No hemodynamically significant stenosis within the visible innominate or proximal subclavian arteries. Right carotid system: CCA patent. Mild atherosclerotic plaque within this vessel without hemodynamically significant stenosis. Atherosclerotic plaque at the carotid bifurcation and within the proximal ICA. The ICA becomes occluded shortly beyond its origin and remains occluded throughout the remainder of the neck. Left carotid system: The origin of the CCA is excluded from the field of view. The visualized CCA is patent with atherosclerotic plaque, but without hemodynamically significant stenosis. Atherosclerotic plaque at  the carotid bifurcation and within the proximal ICA. No hemodynamically significant stenosis within the proximal ICA. 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 Vertebral arteries: Vertebral arteries patent within the neck without stenosis. The left vertebral artery is dominant. Skeleton: Cervical spine CT reported separately. Other neck: No neck mass or cervical lymphadenopathy. Upper chest: Emphysema. No consolidation within the imaged lung apices. Review of the MIP images confirms the above findings CTA HEAD FINDINGS Anterior circulation: The right ICA remains occluded to the level of the distal cavernous segment. Subocclusive thrombus is present within the distal cavernous segment (for instance as seen on series 7, image 111). There is more robust reconstitution of enhancement at the paraclinoid and supraclinoid level. There is abrupt occlusion of the right middle cerebral artery at the proximal M1 segment level. There is some enhancement of M2 and more distal right MCA vessels, although asymmetrically diminished as compared to fat on the left. The intracranial left ICA is patent. Scattered atherosclerotic plaque within this vessel without stenosis. The M1 left middle cerebral artery is patent. No left M2 proximal branch occlusion. The anterior cerebral arteries are patent. No intracranial aneurysm is identified. 1-2 mm posteriorly inferiorly projecting vascular protrusion arising from the paraclinoid right ICA, which may reflect an aneurysm (series 9, image 84). Posterior circulation: The intracranial vertebral arteries are patent. Calcified atherosclerotic plaque within the left V4 segment with no more than mild stenosis. The basilar artery is patent. The posterior cerebral arteries are patent. Posterior communicating arteries are present, bilaterally Venous sinuses: Within the limitations of contrast timing, no convincing thrombus. Anatomic variants: None significant. Review  of the MIP images confirms the above  findings Critical/emergent results were called by telephone at the time of interpretation on 04/12/2022 at 8:40 am to provider [facility] , who verbally acknowledged these results. IMPRESSION: CTA neck: 1. The origins of the innominate artery, left common carotid artery and left subclavian artery are excluded from the field of view. 2. The right ICA becomes occluded just beyond its origin, and remains occluded throughout the remainder of the neck. Although the appearance is nonspecific, right ICA dissection is a consideration. 3. Within described limitations, there is no hemodynamically significant stenosis of the left common carotid or cervical internal carotid arteries. 4. Vertebral arteries patent within the neck without stenosis. 5. 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 6.  Aortic Atherosclerosis (ICD10-I70.0). CTA head: 1. The intracranial right ICA remains occluded to the distal cavernous segment. Subocclusive thrombus is present within the distal cavernous segment. There is more robust reconstitution of right ICA enhancement at the paraclinoid and supraclinoid level. However, there is abrupt occlusion of the right middle cerebral artery at the proximal M1 segment level. 2. 1-2 mm posteroinferiorly projecting vascular protrusion arising from the paraclinoid right ICA, which may reflect an aneurysm. Electronically Signed   By: Jackey LogeKyle  Golden D.O.   On: 04/12/2022 09:12   CT C-SPINE NO CHARGE  Result Date: 04/12/2022 CLINICAL DATA:  Unwitnessed fall. EXAM: CT Cervical Spine without contrast TECHNIQUE: Multiplanar CT images of the cervical spine were reconstructed from contemporary CT of the Neck. RADIATION DOSE REDUCTION: This exam was performed according to the departmental dose-optimization program which includes automated exposure control, adjustment of the mA and/or kV according to patient size and/or use of iterative reconstruction technique. CONTRAST:  None or No additional COMPARISON:  None  Available. FINDINGS: Alignment: Anatomic alignment.  No static listhesis. Skull base and vertebrae: No acute fracture. No aggressive lytic or sclerotic osseous lesion. Soft tissues and spinal canal: Intraspinal soft tissues are not fully imaged on this examination due to poor soft tissue contrast, but there is no gross soft tissue abnormality. No prevertebral fluid or swelling. No visible canal hematoma. Disc levels: Degenerative disease with disc height loss at C5-6, C6-7 and to lesser extent C7-T1 and C4-5. Upper chest: Lung apices are clear.  Centrilobular emphysema. Other: No fluid collection or hematoma. IMPRESSION: 1.  No acute osseous injury of the cervical spine. 2. Cervical spine spondylosis as described above. 3.  Emphysema (ICD10-J43.9). Electronically Signed   By: Elige KoHetal  Patel M.D.   On: 04/12/2022 08:53   CT HEAD CODE STROKE WO CONTRAST  Result Date: 04/12/2022 CLINICAL DATA:  Code stroke. Neuro deficit, acute, stroke suspected. EXAM: CT HEAD WITHOUT CONTRAST TECHNIQUE: Contiguous axial images were obtained from the base of the skull through the vertex without intravenous contrast. RADIATION DOSE REDUCTION: This exam was performed according to the departmental dose-optimization program which includes automated exposure control, adjustment of the mA and/or kV according to patient size and/or use of iterative reconstruction technique. COMPARISON:  No pertinent prior exams available for comparison. FINDINGS: Brain: Mild generalized parenchymal atrophy. Loss of gray-white differentiation within the right insula compatible with an acute right MCA territory infarct (for instance as seen on series 2, images 15 and 16). There is no acute intracranial hemorrhage. No extra-axial fluid collection. No evidence of an intracranial mass. No midline shift. Vascular: Atherosclerotic calcifications. Asymmetric hyperdensity of the M1 right middle cerebral artery suspicious for the presence of endoluminal thrombus.  Skull: No fracture or aggressive osseous lesion. Sinuses/Orbits: No mass  or acute finding within the imaged orbits. Mucosal thickening within the bilateral maxillary sinuses at the imaged levels (trace right, mild left). ASPECTS (Alberta Stroke Program Early CT Score) - Ganglionic level infarction (caudate, lentiform nuclei, internal capsule, insula, M1-M3 cortex): 6 - Supraganglionic infarction (M4-M6 cortex): 3 Total score (0-10 with 10 being normal): 9 Critical/emergent results were called by telephone at the time of interpretation on 04/12/2022 at 8:38 am to provider Dr. Otelia Limes, who verbally acknowledged these results. 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 ASPECTS is 9. Asymmetric hyperdensity of the M1 right middle cerebral artery suspicious for the presence of endoluminal thrombus. Correlate with findings on pending CTA head/neck. Mild generalized parenchymal atrophy. Electronically Signed   By: Jackey Loge D.O.   On: 04/12/2022 08:43    Labs:  CBC: Recent Labs    04/12/22 0820 04/12/22 0831  WBC 6.1  --   HGB 15.3 14.6  HCT 43.1 43.0  PLT 200  --     COAGS: Recent Labs    04/12/22 0820  INR 1.0  APTT 28    BMP: Recent Labs    04/12/22 0820 04/12/22 0831  NA 140 139  K 4.2 4.0  CL 105 105  CO2 25  --   GLUCOSE 174* 174*  BUN 12 11  CALCIUM 9.2  --   CREATININE 1.22 1.00  GFRNONAA >60  --     LIVER FUNCTION TESTS: Recent Labs    04/12/22 0820  BILITOT 0.8  AST 29  ALT 23  ALKPHOS 69  PROT 6.9  ALBUMIN 3.9    Assessment and Plan:  SBP 120-140 mmHg ASA 81 +  Brilinta 180 (load) today; brilinta 90 mg bid and ASA 81 mg qd from tomorrow.  F/u Head CT SAH similar to post thrombectomy CT.  Please call/page NIR with any questions or concerns.  Electronically Signed: Shon Hough, NP 04/12/2022, 3:04 PM   I spent a total of 15 Minutes at the the patient's bedside AND on the patient's hospital floor or unit, greater  than 50% of which was counseling/coordinating care for s/p mechanical thrombectomy.

## 2022-04-12 NOTE — Sedation Documentation (Signed)
Bed control notified of active code stroke

## 2022-04-12 NOTE — ED Notes (Signed)
Pt arrived to IR at this time

## 2022-04-12 NOTE — ED Notes (Signed)
Tammy RN giving report to IR staff at this time

## 2022-04-12 NOTE — ED Provider Notes (Signed)
[facility] EMERGENCY DEPARTMENT Provider Note   CSN: [id number] Arrival date & time: 04/12/22  4268  An emergency department physician performed an initial assessment on this suspected stroke patient at [facility].  History  No chief complaint on file.   Alexander Stewart is a 76 y.o. male.  HPI Patient presents as a code stroke.  Met by myself and Dr. Cheral Marker from neurology [place] the [facility] upon arrival.  Last normal at 710.  Had been shaving and then lost use of his left side.  Left leg is improved some but still flaccid on left upper extremity.  No headache.  Good CBG and blood pressure.  Had been found on the floor by wife.   Past Medical History:  Diagnosis Date   Emphysema lung ([place])    Hypertension    Thyroid disease     Home Medications Prior to Admission medications   Medication Sig Start Date End Date Taking? Authorizing Provider  levothyroxine (SYNTHROID, LEVOTHROID) 25 MCG tablet Take 25 mcg by mouth daily before breakfast.    [provider]  losartan (COZAAR) 25 MG tablet Take 25 mg by mouth daily.    [provider]      Allergies    Patient has no known allergies.    Review of Systems   Review of Systems  Physical Exam Updated Vital Signs BP (!) 156/87   Pulse 70   Resp 16   Ht 5' 7.5" (1.715 m)   Wt 72.6 kg   SpO2 99%   BMI 24.70 kg/m  Physical Exam Vitals and nursing note reviewed.  HENT:     Head: Normocephalic and atraumatic.  Abdominal:     Tenderness: There is no abdominal tenderness.  Musculoskeletal:        General: No tenderness.  Neurological:     Comments: Left-sided facial droop.  Flaccid left upper extremity.  Some movement of left lower extremity.  Complete NIH scoring done by neurology.  Does have left-sided neglect.     ED Results / Procedures / Treatments   Labs (all labs ordered are listed, but only abnormal results are displayed) Labs Reviewed  COMPREHENSIVE METABOLIC PANEL - Abnormal; Notable  for the following components:      Result Value   Glucose, Bld 174 (*)    All other components within normal limits  I-STAT CHEM 8, ED - Abnormal; Notable for the following components:   Glucose, Bld 174 (*)    Calcium, Ion 1.04 (*)    All other components within normal limits  CBG MONITORING, ED - Abnormal; Notable for the following components:   Glucose-Capillary 140 (*)    All other components within normal limits  PROTIME-INR  APTT  CBC  DIFFERENTIAL  ETHANOL    EKG None  Radiology CT ANGIO HEAD NECK W WO CM (CODE STROKE)  Result Date: 04/12/2022 CLINICAL DATA:  Neuro deficit, acute, stroke suspected. EXAM: CT ANGIOGRAPHY HEAD AND NECK TECHNIQUE: Multidetector CT imaging of the head and neck was performed using the standard protocol during bolus administration of intravenous contrast. Multiplanar CT image reconstructions and MIPs were obtained to evaluate the vascular anatomy. Carotid stenosis measurements (when applicable) are obtained utilizing NASCET criteria, using the distal internal carotid diameter as the denominator. RADIATION DOSE REDUCTION: This exam was performed according to the departmental dose-optimization program which includes automated exposure control, adjustment of the mA and/or kV according to patient size and/or use of iterative reconstruction technique. CONTRAST:  67mL OMNIPAQUE IOHEXOL 350 MG/ML  SOLN COMPARISON:  Same day noncontrast head CT 04/12/2022. FINDINGS: CTA NECK FINDINGS Aortic arch: The origins of the innominate, left common carotid and left subclavian arteries are excluded from the field of view. Atherosclerotic plaque within the visualized aortic arch and proximal major branch vessels of the neck. Streak artifact from a dense right-sided contrast bolus partially obscures the right subclavian artery. No hemodynamically significant stenosis within the visible innominate or proximal subclavian arteries. Right carotid system: CCA patent. Mild  atherosclerotic plaque within this vessel without hemodynamically significant stenosis. Atherosclerotic plaque at the carotid bifurcation and within the proximal ICA. The ICA becomes occluded shortly beyond its origin and remains occluded throughout the remainder of the neck. Left carotid system: The origin of the CCA is excluded from the field of view. The visualized CCA is patent with atherosclerotic plaque, but without hemodynamically significant stenosis. Atherosclerotic plaque at the carotid bifurcation and within the proximal ICA. No hemodynamically significant stenosis within the proximal ICA. 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 Vertebral arteries: Vertebral arteries patent within the neck without stenosis. The left vertebral artery is dominant. Skeleton: Cervical spine CT reported separately. Other neck: No neck mass or cervical lymphadenopathy. Upper chest: Emphysema. No consolidation within the imaged lung apices. Review of the MIP images confirms the above findings CTA HEAD FINDINGS Anterior circulation: The right ICA remains occluded to the level of the distal cavernous segment. Subocclusive thrombus is present within the distal cavernous segment (for instance as seen on series 7, image 111). There is more robust reconstitution of enhancement at the paraclinoid and supraclinoid level. There is abrupt occlusion of the right middle cerebral artery at the proximal M1 segment level. There is some enhancement of M2 and more distal right MCA vessels, although asymmetrically diminished as compared to fat on the left. The intracranial left ICA is patent. Scattered atherosclerotic plaque within this vessel without stenosis. The M1 left middle cerebral artery is patent. No left M2 proximal branch occlusion. The anterior cerebral arteries are patent. No intracranial aneurysm is identified. 1-2 mm posteriorly inferiorly projecting vascular protrusion arising from the paraclinoid right ICA, which may reflect an  aneurysm (series 9, image 84). Posterior circulation: The intracranial vertebral arteries are patent. Calcified atherosclerotic plaque within the left V4 segment with no more than mild stenosis. The basilar artery is patent. The posterior cerebral arteries are patent. Posterior communicating arteries are present, bilaterally Venous sinuses: Within the limitations of contrast timing, no convincing thrombus. Anatomic variants: None significant. Review of the MIP images confirms the above findings Critical/emergent results were called by telephone at the time of interpretation on 04/12/2022 at 8:40 am to provider [facility] , who verbally acknowledged these results. IMPRESSION: CTA neck: 1. The origins of the innominate artery, left common carotid artery and left subclavian artery are excluded from the field of view. 2. The right ICA becomes occluded just beyond its origin, and remains occluded throughout the remainder of the neck. Although the appearance is nonspecific, right ICA dissection is a consideration. 3. Within described limitations, there is no hemodynamically significant stenosis of the left common carotid or cervical internal carotid arteries. 4. Vertebral arteries patent within the neck without stenosis. 5. 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 6.  Aortic Atherosclerosis (ICD10-I70.0). CTA head: 1. The intracranial right ICA remains occluded to the distal cavernous segment. Subocclusive thrombus is present within the distal cavernous segment. There is more robust reconstitution of right ICA enhancement at the paraclinoid and supraclinoid level. However, there is abrupt occlusion of  the right middle cerebral artery at the proximal M1 segment level. 2. 1-2 mm posteroinferiorly projecting vascular protrusion arising from the paraclinoid right ICA, which may reflect an aneurysm. Electronically Signed   By: Kellie Simmering D.O.   On: 04/12/2022 09:12   CT C-SPINE NO CHARGE  Result Date:  04/12/2022 CLINICAL DATA:  Unwitnessed fall. EXAM: CT Cervical Spine without contrast TECHNIQUE: Multiplanar CT images of the cervical spine were reconstructed from contemporary CT of the Neck. RADIATION DOSE REDUCTION: This exam was performed according to the departmental dose-optimization program which includes automated exposure control, adjustment of the mA and/or kV according to patient size and/or use of iterative reconstruction technique. CONTRAST:  None or No additional COMPARISON:  None Available. FINDINGS: Alignment: Anatomic alignment.  No static listhesis. Skull base and vertebrae: No acute fracture. No aggressive lytic or sclerotic osseous lesion. Soft tissues and spinal canal: Intraspinal soft tissues are not fully imaged on this examination due to poor soft tissue contrast, but there is no gross soft tissue abnormality. No prevertebral fluid or swelling. No visible canal hematoma. Disc levels: Degenerative disease with disc height loss at C5-6, C6-7 and to lesser extent C7-T1 and C4-5. Upper chest: Lung apices are clear.  Centrilobular emphysema. Other: No fluid collection or hematoma. IMPRESSION: 1.  No acute osseous injury of the cervical spine. 2. Cervical spine spondylosis as described above. 3.  Emphysema (ICD10-J43.9). Electronically Signed   By: Kathreen Devoid M.D.   On: 04/12/2022 08:53   CT HEAD CODE STROKE WO CONTRAST  Result Date: 04/12/2022 CLINICAL DATA:  Code stroke. Neuro deficit, acute, stroke suspected. EXAM: CT HEAD WITHOUT CONTRAST TECHNIQUE: Contiguous axial images were obtained from the base of the skull through the vertex without intravenous contrast. RADIATION DOSE REDUCTION: This exam was performed according to the departmental dose-optimization program which includes automated exposure control, adjustment of the mA and/or kV according to patient size and/or use of iterative reconstruction technique. COMPARISON:  No pertinent prior exams available for comparison. FINDINGS:  Brain: Mild generalized parenchymal atrophy. Loss of gray-white differentiation within the right insula compatible with an acute right MCA territory infarct (for instance as seen on series 2, images 15 and 16). There is no acute intracranial hemorrhage. No extra-axial fluid collection. No evidence of an intracranial mass. No midline shift. Vascular: Atherosclerotic calcifications. Asymmetric hyperdensity of the M1 right middle cerebral artery suspicious for the presence of endoluminal thrombus. Skull: No fracture or aggressive osseous lesion. Sinuses/Orbits: No mass or acute finding within the imaged orbits. Mucosal thickening within the bilateral maxillary sinuses at the imaged levels (trace right, mild left). ASPECTS ([place] Stroke Program Early CT Score) - Ganglionic level infarction (caudate, lentiform nuclei, internal capsule, insula, M1-M3 cortex): 6 - Supraganglionic infarction (M4-M6 cortex): 3 Total score (0-10 with 10 being normal): 9 Critical/emergent results were called by telephone at the time of interpretation on 04/12/2022 at 8:38 am to provider Dr. Cheral Marker, who verbally acknowledged these results. 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 ASPECTS is 9. Asymmetric hyperdensity of the M1 right middle cerebral artery suspicious for the presence of endoluminal thrombus. Correlate with findings on pending CTA head/neck. Mild generalized parenchymal atrophy. Electronically Signed   By: Kellie Simmering D.O.   On: 04/12/2022 08:43    Procedures Procedures    Medications Ordered in ED Medications  sodium chloride flush (NS) 0.9 % injection 3 mL (has no administration in time range)  iohexol (OMNIPAQUE) 300 MG/ML solution 100 mL (has no administration  in time range)  iohexol (OMNIPAQUE) 350 MG/ML injection 80 mL (80 mLs Intravenous Contrast Given 04/12/22 0839)  tenecteplase (TNKASE) injection for Stroke 18 mg (18 mg Intravenous Given 04/12/22 0841)  fentaNYL  (SUBLIMAZE) 100 MCG/2ML injection (  Override pull for Anesthesia 04/12/22 0908)    ED Course/ Medical Decision Making/ A&P                           Medical Decision Making Amount and/or Complexity of Data Reviewed Labs: ordered. Radiology: ordered.  Risk Decision regarding hospitalization.   Patient presented as a code stroke.  Not moving left arm left facial droop and left-sided neglect.  LVO positive.  Initial head CT did show likely stroke and CTA did show ICA and MCA occlusion.  Had been given TNK and wait taken to interventional radiology.  Family not initially reachable.  Dr. Cheral Marker and I both agreed that with his neglect he was not able to make a decision about TNK and we did two-physician consent. Found on the floor.  Another reason for the paralysis besides trauma.  Cervical spine CT done and reassuring.  Cervical spine can be cleared I inform neurology of this although patient was already in interventional radiology.  CRITICAL CARE Performed by: Davonna Belling Total critical care time: 30 minutes Critical care time was exclusive of separately billable procedures and treating other patients. Critical care was necessary to treat or prevent imminent or life-threatening deterioration. Critical care was time spent personally by me on the following activities: development of treatment plan with patient and/or surrogate as well as nursing, discussions with consultants, evaluation of patient's response to treatment, examination of patient, obtaining history from patient or surrogate, ordering and performing treatments and interventions, ordering and review of laboratory studies, ordering and review of radiographic studies, pulse oximetry and re-evaluation of patient's condition.         Final Clinical Impression(s) / ED Diagnoses Final diagnoses:  Cerebrovascular accident (CVA), unspecified mechanism [facility])    Rx / [place] Orders ED Discharge Orders     None          Davonna Belling, MD 04/12/22 [phone]

## 2022-04-12 NOTE — Progress Notes (Signed)
Patient received from OR.  Bedside report received from RN and CRNA.  Per OR RN continue to infuse Cangrelor until CT scan at 1330.  First vital signs charted at 1130.  Groin site assess and noted closed at 1055.  Groin site level 0, dressing clean dry and intact.  Pulses dopplerable.  Bladder scanned with 258 volume.  Foley placed.  Patient's blood pressure noted to be lower than goal.  Dr. Debbrah Alar paged and verbal order to change blood pressure goal to 100-140 and to start Neosynephrine if systolic blood pressure below 100. Family at bedside and belongings returned. RN to continue to monitor.

## 2022-04-12 NOTE — Anesthesia Postprocedure Evaluation (Signed)
Anesthesia Post Note  Patient: Alexander Stewart  Procedure(s) Performed: IR WITH ANESTHESIA     Patient location during evaluation: PACU Anesthesia Type: General Level of consciousness: awake and alert Pain management: pain level controlled Vital Signs Assessment: post-procedure vital signs reviewed and stable Respiratory status: spontaneous breathing, nonlabored ventilation, respiratory function stable and patient connected to nasal cannula oxygen Cardiovascular status: blood pressure returned to baseline and stable Postop Assessment: no apparent nausea or vomiting Anesthetic complications: no   No notable events documented.  Last Vitals:  Vitals:   04/12/22 1400 04/12/22 1430  BP: 123/77 127/73  Pulse: 85 88  Resp: 19 12  SpO2: 98% 92%    Last Pain:  Vitals:   04/12/22 0845  PainSc: 0-No pain                 Santa Lighter

## 2022-04-12 NOTE — Procedures (Addendum)
INTERVENTIONAL NEURORADIOLOGY BRIEF POSTPROCEDURE NOTE  DIAGNOSTIC CEREBRAL ANGIOGRAM MECHANICAL THROMBECTOMY RIGHT CAROTID ANGIOPLASTY AND STENTING INTRACRANIAL STENTING  Attending: Dr. 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  Access site: RCFA  Access closure: Perclose Prostyle  Anesthesia: GETA  Medication used: Cangrelor IV bolus and drip.  Complications: Trace SAH vs contrast extrav in right high convexity.  Estimated blood loss: 100 mL  Specimen: None.  Findings: Occlusion of the cervical right ICA at the bulb and mid/distal right M1/MCA occlusion. Mechanical thrombectomy performed with combined aspiration + stent retriever with complete recanalization. Angioplasty and stenting was performed in the neck for high grade stenosis with resolution of stenosis. Follow-up angiogram showed reocclusion of the right M3/MCA superior division and small non flow limiting dissection in the petrous right ICA. Mechanical thrombectomy performed with stent retriever with complete recanalization (TICI 3). A 4 mm neuroform atlas stent was placed in the intracranial right ICA across the dissection.  Post procedural flat panel CT showed small right sylvian and high convexity hyperdensity  concerning for SAH versus contrast extravasation. High convexity location is atypical for thrombectomy related SAH, therefore, procedure related vs TNK related SAH are considered.   The patient tolerated the procedure well without incident and is in stable condition.   PLAN: - bed rest x 6 hours s/p femoral puncture - SBP 120-140 mmHg - continue cangrelor drip until decision is made to load ASA +  cangrelor. Please call/page with any questions or concerns - Head CT at 13:30 to evaluate progression of [facility]

## 2022-04-12 NOTE — ED Triage Notes (Signed)
Pt arrived to ED via EMS as a CODE STROKE. Pt transported from his home after EMS was called d/t pt found down and stroke s/s. C-collar applied by EMS per EMS protocol, no obvious head injury per EMS. Pt was shaving his face at 0710, had an unwitnessed fall. Initial s/s per EMS: L side no movement, slurred speech, R gaze and initially not A&Ox4. Upon arrival to ED, R gaze not noted and pt A&Ox4. VSS w/ EMS, CBG 128. 16g L hand, 16g R FA. Hx of HTN, hyperlipidemia. Meds: lovastatin, metoprolol. No allergies.    Upon ED staff assessment, pt exhibited the following s/s: L visual cut, LUE ataxia, L facial droop, L arm sensory deficit, L arm weakness and L arm neglect.   Pt received TNK at 0841 and was transported to IR and arrived in IR suite at 0847.

## 2022-04-12 NOTE — Code Documentation (Signed)
Stroke Response Nurse Documentation Code Documentation  Alexander Stewart is a 76 y.o. male arriving to [facility]  via [facility] on 04/12/2022 with past medical hx of hypertension, thyroid disease and emphysema lung. Code stroke was activated by EMS.   Patient from home where he was LKW at 0710 shaving in bathroom. Wife found him on the floor shortly after. EMS placed c collar per protocol.   Stroke team at the bedside on patient arrival. Labs drawn and patient cleared for CT by Dr. Alvino Chapel. Patient to CT with team. NIHSS 10, see documentation for details and code stroke times. Patient with right gaze preference , left hemianopia, left facial droop, left arm weakness, left limb ataxia, left decreased sensation, and Visual  neglect on exam. The following imaging was completed:  CT Head and CTA. BP 153/85. Patient is a candidate for IV Thrombolytic. TNK given 0841. Patient is a candidate for IR due to LVO positive. CT head impression: "M1 right middle cerebral artery suspicious for presence of endoluminal thrombus". Covid swab completed and patient prepped for IR suite.  Both Neurology and IR providers discussed with patient's wife and obtained consent for IR.   Care Plan: admit to ICU; follow post TNK/IR orders.   Bedside handoff with IR RN Marylyn Ishihara.  Alexander Stewart Stroke Response RN

## 2022-04-12 NOTE — Sedation Documentation (Signed)
CT head to be obtained at 1330. Continue cangrelor infusion. Decision to continue cangrelor pending results of CT. Do not discontinue infusion until Dr. Debbrah Alar reads scan.

## 2022-04-12 NOTE — Progress Notes (Signed)
PHARMACIST CODE STROKE RESPONSE  Notified to mix TNK at 08:38 by Dr. Cheral Marker TNK preparation completed at 08:41  TNK dose = 18 mg IV over 5 seconds.   Issues/delays encountered (if applicable): None  Kaleen Mask 04/12/22 8:28 AM

## 2022-04-12 NOTE — Transfer of Care (Signed)
Immediate Anesthesia Transfer of Care Note  Patient: Alexander Stewart  Procedure(s) Performed: IR WITH ANESTHESIA  Patient Location: NICU  Anesthesia Type:General  Level of Consciousness: awake and alert   Airway & Oxygen Therapy: Patient Spontanous Breathing and Patient connected to face mask oxygen  Post-op Assessment: Report given to RN and Post -op Vital signs reviewed and stable  Post vital signs: Reviewed and stable  Last Vitals:  Vitals Value Taken Time  BP 133/103 04/12/22 1120  Temp    Pulse 76 04/12/22 1127  Resp 12 04/12/22 1127  SpO2 100 % 04/12/22 1127  Vitals shown include unvalidated device data.  Last Pain:  Vitals:   04/12/22 0845  PainSc: 0-No pain         Complications: No notable events documented.

## 2022-04-13 ENCOUNTER — Inpatient Hospital Stay (HOSPITAL_COMMUNITY): Payer: Medicare Other

## 2022-04-13 ENCOUNTER — Other Ambulatory Visit (HOSPITAL_COMMUNITY): Payer: Self-pay

## 2022-04-13 DIAGNOSIS — I6340 Cerebral infarction due to embolism of unspecified cerebral artery: Secondary | ICD-10-CM | POA: Diagnosis not present

## 2022-04-13 DIAGNOSIS — I639 Cerebral infarction, unspecified: Secondary | ICD-10-CM

## 2022-04-13 DIAGNOSIS — E7800 Pure hypercholesterolemia, unspecified: Secondary | ICD-10-CM

## 2022-04-13 DIAGNOSIS — Z8679 Personal history of other diseases of the circulatory system: Secondary | ICD-10-CM | POA: Diagnosis not present

## 2022-04-13 LAB — LIPID PANEL
Cholesterol: 130 mg/dL (ref 0–200)
HDL: 39 mg/dL — ABNORMAL LOW (ref >40)
LDL Cholesterol: 76 mg/dL (ref 0–99)
Total CHOL/HDL Ratio: 3.3 RATIO
Triglycerides: 74 mg/dL (ref <150)
VLDL: 15 mg/dL (ref 0–40)

## 2022-04-13 MED ORDER — ROSUVASTATIN CALCIUM 20 MG PO TABS
20.0000 mg | ORAL_TABLET | Freq: Every day | ORAL | Status: DC
  Administered 2022-04-13 – 2022-04-14 (×2): 20 mg via ORAL
  Filled 2022-04-13 (×2): qty 1

## 2022-04-13 MED ORDER — PRAVASTATIN SODIUM 10 MG PO TABS: 20.0000 mg | ORAL_TABLET | Freq: Every day | ORAL | Status: DC

## 2022-04-13 NOTE — Evaluation (Signed)
Occupational Therapy Evaluation Patient Details Name: Alexander Stewart MRN: [id number] DOB: Jul 29, 1945 Today's Date: 04/13/2022   History of Present Illness Alexander Stewart is an 76 y.o. male who presented from home with L sided weakness, left hemneglect, left facial droop and left visual field cut and found down on the floor. Post-TNK and VIR. 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   Clinical Impression   Alexander Stewart was evaluated s/p the above admission list, he is indep at baseline and lives with his wife in a 2 level home with bed/bath on the second level. Upon evaluation pt had mild functional limitations due to slow processing, generalized weakness and decreased activity tolerance. Overall he required up to min G for OOB tasks fur balance and safety, otherwise he needed superivsion A for sitting ADLs. He will benefit from OT acutely to further assess higher level cognition. Recommend d/c to home with support of family, no follow up OT needed.      Recommendations for follow up therapy are one component of a multi-disciplinary discharge planning process, led by the attending physician.  Recommendations may be updated based on patient status, additional functional criteria and insurance authorization.   Follow Up Recommendations  No OT follow up    Assistance Recommended at Discharge Intermittent Supervision/Assistance  Patient can return home with the following A little help with walking and/or transfers;A little help with bathing/dressing/bathroom;Assistance with cooking/housework;Direct supervision/assist for financial management;Direct supervision/assist for medications management;Assist for transportation;Help with stairs or ramp for entrance    Functional Status Assessment  Patient has had a recent decline in their functional status and demonstrates the ability to make significant improvements in function in a reasonable and predictable amount  of time.  Equipment Recommendations  None recommended by OT    Recommendations for Other Services       Precautions / Restrictions Precautions Precautions: Fall Restrictions Weight Bearing Restrictions: No      Mobility Bed Mobility Overal bed mobility: Modified Independent             General bed mobility comments: incr time and HOB elevated    Transfers Overall transfer level: Needs assistance Equipment used: None Transfers: Sit to/from Stand Sit to Stand: Min guard           General transfer comment: mildly unsteady upon standing, balance improved with increased distance      Balance Overall balance assessment: Needs assistance Sitting-balance support: Feet supported Sitting balance-Leahy Scale: Good     Standing balance support: No upper extremity supported, During functional activity Standing balance-Leahy Scale: Fair                             ADL either performed or assessed with clinical judgement   ADL Overall ADL's : Needs assistance/impaired Eating/Feeding: Independent;Sitting   Grooming: Supervision/safety;Standing   Upper Body Bathing: Set up;Sitting   Lower Body Bathing: Min guard;Sit to/from stand   Upper Body Dressing : Set up;Sitting   Lower Body Dressing: Min guard;Sit to/from stand   Toilet Transfer: Min guard;Ambulation   Toileting- Clothing Manipulation and Hygiene: Supervision/safety;Sitting/lateral lean       Functional mobility during ADLs: Min guard General ADL Comments: min G for safety for OOB tasks, pt mildly unsteady     Vision Baseline Vision/History: 1 Wears glasses Vision Assessment?: No apparent visual deficits     Perception     Praxis  Pertinent Vitals/Pain Pain Assessment Pain Assessment: No/denies pain     Hand Dominance Right   Extremity/Trunk Assessment Upper Extremity Assessment Upper Extremity Assessment: Overall WFL for tasks assessed   Lower Extremity  Assessment Lower Extremity Assessment: Defer to PT evaluation   Cervical / Trunk Assessment Cervical / Trunk Assessment: Normal   Communication Communication Communication: No difficulties   Cognition Arousal/Alertness: Awake/alert Behavior During Therapy: Flat affect Overall Cognitive Status: No family/caregiver present to determine baseline cognitive functioning                                 General Comments: Overall oriented and WFL for functional tasks assessed. Slow porcessing with 5-10 second delay in responses throughout     General Comments  VSS on RA    Exercises     Shoulder Instructions      Home Living Family/patient expects to be discharged to:: Private residence Living Arrangements: Spouse/significant other Available Help at Discharge: Available 24 hours/day Type of Home: House Home Access: Level entry     Home Layout: Two level;Bed/bath upstairs Alternate Level Stairs-Number of Steps: flight Alternate Level Stairs-Rails: Right Bathroom Shower/Tub: Occupational psychologist: Standard     Home Equipment: None          Prior Functioning/Environment Prior Level of Function : Independent/Modified Independent;Driving             Mobility Comments: no AD ADLs Comments: drives, IADLs indep        OT Problem List: Decreased activity tolerance;Impaired balance (sitting and/or standing);Decreased cognition;Decreased safety awareness      OT Treatment/Interventions: Self-care/ADL training;Therapeutic exercise;DME and/or AE instruction;Patient/family education;Balance training    OT Goals(Current goals can be found in the care plan section) Acute Rehab OT Goals Patient Stated Goal: did not state OT Goal Formulation: With patient Time For Goal Achievement: 04/27/22 Potential to Achieve Goals: Good ADL Goals Pt Will Perform Grooming: Independently;standing Pt Will Perform Lower Body Dressing: Independently;sit to/from  stand Pt Will Transfer to Toilet: Independently;ambulating Additional ADL Goal #1: Pt will independently follow 3 step wayfinding task in preparation for higher level cognitive tasks Additional ADL Goal #2: Pt will indep complete IADL medication management task  OT Frequency: Min 2X/week       AM-PAC OT "6 Clicks" Daily Activity     Outcome Measure Help from another person eating meals?: None Help from another person taking care of personal grooming?: A Little Help from another person toileting, which includes using toliet, bedpan, or urinal?: A Little Help from another person bathing (including washing, rinsing, drying)?: A Little Help from another person to put on and taking off regular upper body clothing?: None Help from another person to put on and taking off regular lower body clothing?: A Little 6 Click Score: 20   End of Session Equipment Utilized During Treatment: Gait belt Nurse Communication: Mobility status  Activity Tolerance: Patient tolerated treatment well Patient left: in chair;with call bell/phone within reach;with chair alarm set  OT Visit Diagnosis: Unsteadiness on feet (R26.81);Muscle weakness (generalized) (M62.81)                Time: 9562-1308 OT Time Calculation (min): 20 min Charges:  OT General Charges $OT Visit: 1 Visit OT Evaluation $OT Eval Moderate Complexity: 1 Mod    Kjell Brannen D Causey 04/13/2022, 10:46 AM

## 2022-04-13 NOTE — Evaluation (Signed)
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   Past Medical History:  Past Medical History:  Diagnosis Date   Emphysema lung (HCC)    Hypertension    Thyroid disease    Past Surgical History:  Past Surgical History:  Procedure Laterality Date   IR CT HEAD LTD  04/12/2022   IR CT HEAD LTD  04/12/2022   IR INTRA CRAN STENT  04/12/2022   IR INTRAVSC STENT CERV CAROTID W/EMB-PROT MOD SED INCL ANGIO  04/12/2022   IR PERCUTANEOUS ART THROMBECTOMY/INFUSION INTRACRANIAL INC DIAG ANGIO  04/12/2022   IR US GUIDE VASC ACCESS RIGHT  04/12/2022   HPI:  76yo male admitted 04/12/22 with unilateral weakness, hemineglect, facial droop and visual field cut on the left. Pt found down on the floor. PMH: ICH (after a fall, 2016), HTN, emphysema, thyroid disease. CTHead = small acute right MCA infarct. MRI = patchy MCA infarcts, small infarcts bilateral medial temporal lobes, SAH right cerebral convexity   Assessment / Plan / Recommendation Clinical Impression  Pt seen at bedside to assess speech, language, and cognition. Pt was seated upright in recliner, awake and alert. No family present. Pt reports having a college education. He is retired from the [employer]. Pt reports his wife distributes his medications and then he takes them. He is responsible for all finances. He was driving prior to admit. During writing task, pt was noticed to have a tremor in his right hand. The tremor was not present at rest.  Pt presents with fully intelligible speech. Receptive and Expressive Language are intact. The [employer] Mental Status (SLUMS) Examination was administered to assess cognition. Pt scored 27/30, indicating performance WNL. Pt lost points on  thought organization (naming 10 animals), and clock drawing (hour hand placement was incorrect). While pt's score is WFL, difficulty with thought organization and executive functions can have adverse effects on safety and independence at home. OP or HH ST is available if needs arise after pt returns to his normal routines/functioning.    SLP Assessment  SLP Recommendation/Assessment: All further Speech Language Pathology needs can be addressed in the next venue of care (if needs arise)  SLP Visit Diagnosis: Cognitive communication deficit (R41.841)    Recommendations for follow up therapy are one component of a multi-disciplinary discharge planning process, led by the attending physician.  Recommendations may be updated based on patient status, additional functional criteria and insurance authorization.    Follow Up Recommendations  Follow physician's recommendations for discharge plan and follow up therapies    Assistance Recommended at Discharge  Intermittent Supervision/Assistance  Functional Status Assessment Patient has not had a recent decline in their functional status     SLP Evaluation Cognition  Overall Cognitive Status: No family/caregiver present to determine baseline cognitive functioning Arousal/Alertness: Awake/alert Orientation Level: Oriented X4       Comprehension  Auditory Comprehension Overall Auditory Comprehension: Appears within functional limits for tasks assessed    Expression Expression Primary Mode of Expression: Verbal Verbal Expression Overall Verbal Expression: Appears within functional limits for tasks assessed Written Expression Dominant Hand: Right   Oral / Motor  Oral Motor/Sensory Function Overall Oral Motor/Sensory Function: Within functional limits Motor Speech Overall Motor Speech: Appears within functional limits for tasks assessed Intelligibility: Intelligible  Kambree Krauss B. Quentin Ore, [facility], [place] Speech Language Pathologist Office:  [phone]  Shonna Chock 04/13/2022, 12:09 PM

## 2022-04-13 NOTE — Progress Notes (Addendum)
STROKE TEAM PROGRESS NOTE   INTERVAL HISTORY No family at the bedside  Patient previously had loop recorder in 2015 after his fall and brain bleed in [place]. It was then taken out in 2022 by [facility] cardiology. He tells [place] that they never found any abnormal rhythms. May consider a Zio patch at discharge given new event.   Vitals:   04/13/22 0600 04/13/22 0621 04/13/22 0700 04/13/22 0800  BP: (!) 148/79 (!) 134/96 119/72 125/62  Pulse: 98 90 71 66  Resp: 18 15 14 13   Temp:    97.8 F (36.6 C)  TempSrc:    Oral  SpO2: (!) 65% 97% 98% 98%  Weight:      Height:       CBC:  Recent Labs  Lab 04/12/22 0820 04/12/22 0831  WBC 6.1  --   NEUTROABS 3.0  --   HGB 15.3 14.6  HCT 43.1 43.0  MCV 95.6  --   PLT 200  --    Basic Metabolic Panel:  Recent Labs  Lab 04/12/22 0820 04/12/22 0831  NA 140 139  K 4.2 4.0  CL 105 105  CO2 25  --   GLUCOSE 174* 174*  BUN 12 11  CREATININE 1.22 1.00  CALCIUM 9.2  --    Lipid Panel:  Recent Labs  Lab 04/13/22 0201  CHOL 130  TRIG 74  HDL 39*  CHOLHDL 3.3  VLDL 15  LDLCALC 76   HgbA1c:  Recent Labs  Lab 04/12/22 0820  HGBA1C 5.6   Urine Drug Screen: No results for input(s): "LABOPIA", "COCAINSCRNUR", "LABBENZ", "AMPHETMU", "THCU", "LABBARB" in the last 168 hours.  Alcohol Level  Recent Labs  Lab 04/12/22 0820  ETH <10    IMAGING past 24 hours MR BRAIN WO CONTRAST  Result Date: 04/13/2022 CLINICAL DATA:  Stroke follow-up EXAM: MRI HEAD WITHOUT CONTRAST TECHNIQUE: Multiplanar, multiecho pulse sequences of the brain and surrounding structures were obtained without intravenous contrast. COMPARISON:  Head CT from yesterday FINDINGS: Brain: Patchy restricted diffusion along the right cerebral cortex, greatest at the anterior insula and frontal operculum. Punctate areas of restricted diffusion in the bilateral medial temporal lobe close to the hippocampi, PCA distribution. Mild restricted diffusion in the right caudate nucleus best  seen on FLAIR imaging there is hyperintensity within sulci of the right cerebral convexity, correlating with high-density areas by prior CT. Vascular: Major flow voids are preserved Skull and upper cervical spine: Normal marrow signal Sinuses/Orbits: Negative IMPRESSION: 1. 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 Small infarcts also seen in the bilateral medial temporal lobes. 2. Subarachnoid hemorrhage along the right cerebral convexity similar to prior head CT. Electronically Signed   By: Jorje Guild M.D.   On: 04/13/2022 05:48   CT HEAD WO CONTRAST (5MM)  Result Date: 04/12/2022 CLINICAL DATA:  Status post mechanical thrombectomy, follow-up subarachnoid hemorrhage EXAM: CT HEAD WITHOUT CONTRAST TECHNIQUE: Contiguous axial images were obtained from the base of the skull through the vertex without intravenous contrast. RADIATION DOSE REDUCTION: This exam was performed according to the departmental dose-optimization program which includes automated exposure control, adjustment of the mA and/or kV according to patient size and/or use of iterative reconstruction technique. COMPARISON:  04/12/2022 CT head 11:21 a.m. and 04/12/2022 CT head 8:31 a.m. FINDINGS: Evaluation is somewhat limited by motion artifact. Brain: Subarachnoid hyperdense material in the right frontal, temporal, and frontoparietal regions (series 7, images 7, 13, and 20, respectively), which  appears similar in size and distribution to the CT done immediately post thrombectomy. Additional hyperdense material in the right inferior frontal lobe is also favored to be subarachnoid and appears less conspicuous than on the post thrombectomy CT. Previously noted loss of gray-white differentiation in the right insula is less conspicuous, possibly due to the hyperdense material. No additional acute infarct, mass, mass effect, or midline shift. No hydrocephalus. Vascular: Residual  contrast is noted in the blood vessels. No definite hyperdense vessel. Atherosclerotic calcifications in the intracranial carotid and vertebral arteries. Skull: Negative for fracture or focal lesion. Sinuses/Orbits: No acute finding. Other: The mastoids are well aerated. IMPRESSION: 1. Subarachnoid hyperdense material in the right cerebral hemisphere is favored to represent a small amount of subarachnoid hemorrhage, which appears similar to the immediate post thrombectomy CT. This could also represent contrast staining. 2. No additional acute intracranial process. Electronically Signed   By: Merilyn Baba M.D.   On: 04/12/2022 14:36   ECHOCARDIOGRAM COMPLETE  Result Date: 04/12/2022    ECHOCARDIOGRAM REPORT   Patient Name:   Alexander Stewart Date of Exam: 04/12/2022 Medical Rec #:  [id number]    Height:       67.5 in Accession #:    [id number]   Weight:       160.1 lb Date of Birth:  Nov 01, 1945   BSA:          1.850 m Patient Age:    13 years     BP:           105/90 mmHg Patient Gender: M            HR:           76 bpm. Exam Location:  Inpatient Procedure: 2D Echo Indications:    stroke  History:        Patient has no prior history of Echocardiogram examinations.                 Stroke; Risk Factors:Hypertension, Former Smoker and emphysema.  Sonographer:    Harvie Junior Referring Phys: [phone] ERIC LINDZEN  Sonographer Comments: Technically difficult study due to poor echo windows, suboptimal parasternal window, suboptimal apical window and suboptimal subcostal window. Image acquisition challenging due to uncooperative patient, Image acquisition challenging  due to respiratory motion and supine. IMPRESSIONS  1. Left ventricular ejection fraction, by estimation, is 60 to 65%. The left ventricle has normal function. The left ventricle has no regional wall motion abnormalities. Left ventricular diastolic parameters are consistent with Grade I diastolic dysfunction (impaired relaxation).  2. Right ventricular systolic  function is normal. The right ventricular size is normal. Tricuspid regurgitation signal is inadequate for assessing PA pressure.  3. The mitral valve is normal in structure. No evidence of mitral valve regurgitation. No evidence of mitral stenosis.  4. The aortic valve is normal in structure. Aortic valve regurgitation is not visualized. No aortic stenosis is present.  5. The inferior vena cava is normal in size with greater than 50% respiratory variability, suggesting right atrial pressure of 3 mmHg. Conclusion(s)/Recommendation(s): No intracardiac source of embolism detected on this transthoracic study. Consider a transesophageal echocardiogram to exclude cardiac source of embolism if clinically indicated. FINDINGS  Left Ventricle: Left ventricular ejection fraction, by estimation, is 60 to 65%. The left ventricle has normal function. The left ventricle has no regional wall motion abnormalities. The left ventricular internal cavity size was normal in size. There is  no left ventricular hypertrophy. Left ventricular diastolic parameters are consistent with  Grade I diastolic dysfunction (impaired relaxation). Normal left ventricular filling pressure. Right Ventricle: The right ventricular size is normal. No increase in right ventricular wall thickness. Right ventricular systolic function is normal. Tricuspid regurgitation signal is inadequate for assessing PA pressure. Left [facility]: Left atrial size was normal in size. Right [facility]: Right atrial size was normal in size. Pericardium: There is no evidence of pericardial effusion. Mitral Valve: The mitral valve is normal in structure. No evidence of mitral valve regurgitation. No evidence of mitral valve stenosis. Tricuspid Valve: The tricuspid valve is normal in structure. Tricuspid valve regurgitation is not demonstrated. No evidence of tricuspid stenosis. Aortic Valve: The aortic valve is normal in structure. Aortic valve regurgitation is not visualized. No aortic  stenosis is present. Aortic valve mean gradient measures 3.0 mmHg. Aortic valve peak gradient measures 4.2 mmHg. Aortic valve area, by VTI measures 2.43 cm. Pulmonic Valve: The pulmonic valve was normal in structure. Pulmonic valve regurgitation is not visualized. No evidence of pulmonic stenosis. Aorta: The aortic root is normal in size and structure. Venous: The inferior vena cava is normal in size with greater than 50% respiratory variability, suggesting right atrial pressure of 3 mmHg. IAS/Shunts: The interatrial septum appears to be lipomatous. No atrial level shunt detected by color flow Doppler.  LEFT VENTRICLE PLAX 2D LVIDd:         3.10 cm     Diastology LVIDs:         2.30 cm     LV e' medial:    5.55 cm/s LV PW:         0.90 cm     LV E/e' medial:  8.6 LV IVS:        1.00 cm     LV e' lateral:   7.51 cm/s LVOT diam:     1.80 cm     LV E/e' lateral: 6.3 LV SV:         52 LV SV Index:   28 LVOT Area:     2.54 cm  LV Volumes (MOD) LV vol d, MOD A2C: 44.9 ml LV vol d, MOD A4C: 59.7 ml LV vol s, MOD A2C: 18.3 ml LV vol s, MOD A4C: 21.2 ml LV SV MOD A2C:     26.6 ml LV SV MOD A4C:     59.7 ml LV SV MOD BP:      31.5 ml RIGHT VENTRICLE RV S prime:     15.40 cm/s TAPSE (M-mode): 2.1 cm LEFT ATRIUM           Index LA Vol (A4C): 21.6 ml 11.68 ml/m  AORTIC VALVE AV Area (Vmax):    2.74 cm AV Area (Vmean):   2.25 cm AV Area (VTI):     2.43 cm AV Vmax:           103.00 cm/s AV Vmean:          81.300 cm/s AV VTI:            0.214 m AV Peak Grad:      4.2 mmHg AV Mean Grad:      3.0 mmHg LVOT Vmax:         111.00 cm/s LVOT Vmean:        71.800 cm/s LVOT VTI:          0.204 m LVOT/AV VTI ratio: 0.95  AORTA Ao Root diam: 3.10 cm MITRAL VALVE MV Area (PHT): 3.77 cm    SHUNTS MV Decel Time: 201 msec  Systemic VTI:  0.20 m MV E velocity: 47.60 cm/s  Systemic Diam: 1.80 cm MV A velocity: 96.40 cm/s MV E/A ratio:  0.49 Armanda Magicraci Turner MD Electronically signed by Armanda Magicraci Turner MD Signature Date/Time: 04/12/2022/1:46:08 PM     Final     PHYSICAL EXAM  Physical Exam  Constitutional: Appears well-developed and well-nourished.   Cardiovascular: Normal rate and regular rhythm.  Respiratory: Effort normal, non-labored breathing  Neuro: Mental Status: Patient is awake, alert, oriented to person, place, month, year, and situation. Patient is able to give a clear and coherent history. No signs of aphasia or neglect Cranial Nerves: II: Visual Fields are full. Pupils are equal, round, and reactive to light.   III,IV, VI: EOMI without ptosis or diploplia.  V: Facial sensation is symmetric to temperature VII: Facial movement is symmetric resting and smiling VIII: Hearing is intact to voice X: Palate elevates symmetrically XI: Shoulder shrug is symmetric. XII: Tongue protrudes midline without atrophy or fasciculations.  Motor: Tone is normal. Bulk is normal. 5/5 strength was present in all four extremities.  Sensory: Sensation is symmetric to light touch and temperature in the arms and legs. No extinction to DSS present.  Cerebellar: FNF and HKS are intact bilaterally Baseline tremor in bilateral upper extremities - states worse in right than left    ASSESSMENT/PLAN Alexander Stewart is a 76 y.o. 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  ASPECTS 10.    CTA head & neck The right ICA becomes occluded just beyond its origin, and remains occluded throughout the remainder of the neck. right middle cerebral artery at the proximal M1 segment level. IR with TICI3 but reocclusion of R M3 and dissection of R ICA petrous portion.  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 Small infarcts also seen in the bilateral medial temporal lobes. Subarachnoid hemorrhage along the right cerebral convexity similar to prior head CT. 2D Echo 60-65% Recommend 30 day monitoring on discharge.  LDL 76 HgbA1c 5.6 VTE prophylaxis - SCDs No antithrombotic prior to admission, now on aspirin 81 mg daily and Brilinta (ticagrelor) 90 mg bid for carotid stenting.  Therapy recommendations:  Pending Disposition:  Pending  Hx of traumatic ICH Hx of loop recorder Syncope episode in 2016 in [place], full down from roof with TBI Was told to have traumatic ICH, but could be SAH too. He stated that he was discharged in 2 days.  No cause found for syncope, so loop recorder placed. Pt not happy with the loop monitoring cost, he stopped follow up for loop Moved here in 2017 and had loop explaned in [facility] in 2022 No data can be retrieved at the time of explant.  Pt not interested for another loop at this time. Given current stroke, recommend 30 day monitoring as outpt  Hypertension Home meds:  Metoprolol succinate 25mg  Stable BP goal < 180/105 post TNK Long-term BP goal normotensive  Hyperlipidemia Home meds:  Lovastatin 10mg , resumed in hospital LDL 76, goal < 70 On crestor 20mg  Continue statin at discharge  Other Stroke Risk Factors Advanced Age >/= 56   Other Active Problems   Hospital day # 1  Patient seen and examined by NP/APP with MD. MD to update note as needed.   Janine Ores, DNP, FNP-BC [facility] Pager: [phone]  ATTENDING NOTE: I reviewed above note and agree with the assessment and plan. Pt was seen and examined.   76 year old male with history of hypertension, traumatic ICH 2016 admitted for left-sided  weakness.  CT showed right M1 hyperdense sign.  Status post TNK.  CTA head and neck showed right ICA and right M1 occlusion.  Status post IR with TICI3 reperfusion but subsequent right M3 occlusion, status post redo EVT and again TICI3 but with right ICA petrous portion dissection status post stenting.  MRI showed right MCA patchy infarct in the bilateral temporal small infarcts, right SAH similar.  EF 60 to 65%, A1c 5.6, LDL 76, creatinine 1.00.  TSH elevated but free T4 normal.  Patient had syncopal episode, falling off a roof, had traumatic ICH vs. SAH in 2015.  Had a loop recorder placed due to no cause found for syncope.  However, patient did not follow closely with loop recorder monitoring.  He came to [facility] in 2017 and had loop recorder explanted in 2022.  No loop data can be interrogated at the time.  On exam, patient neurologically intact, no focal deficit.  Etiology for patient stroke still concerning for cardioembolic source.  However, patient seems not interested for another loop recorder, will recommend 30-day cardiac event monitoring as outpatient to rule out A-fib.  Continue aspirin and Brilinta for carotid stent.  On Crestor 20.  For detailed assessment and plan, please refer to above/below as I have made changes wherever appropriate.   Rosalin Hawking, MD PhD Stroke Neurology 04/13/2022 5:26 PM  This patient is critically ill due to stroke, status post TNK, status post mechanical thrombectomy and at significant risk of neurological worsening, death form recurrent stroke, hemorrhagic transformation, bleeding from TNK. This patient's care requires constant monitoring of vital signs, hemodynamics, respiratory and cardiac monitoring, review of multiple databases, neurological assessment, discussion with family, other specialists and medical decision making of high complexity. I spent 40 minutes of neurocritical care time in the care of this patient.    To contact Stroke Continuity provider,  please refer to [email or web address] After hours, contact General Neurology

## 2022-04-13 NOTE — Evaluation (Signed)
Physical Therapy Evaluation Patient Details Name: Alexander Stewart MRN: [id number] DOB: [birth year] Today's Date: 04/13/2022  History of Present Illness  Alexander Stewart is an 76 y.o. male who presented from home with L sided weakness, left hemneglect, left facial droop and left visual field cut and found down on the floor. Post-TNK and VIR. 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  Clinical Impression  Patient presents with some L inattention and slower processing, but mobilizing well in open environment and to negotiate steps with railing.  Family present and supportive.  At baseline pt able to drive, mow the lawn with self-propelled push mower and fairly active.  Needed S to minguard for safety with mobility today.  PT will follow up for more formalized balance assessment and further education if some L inattention remains.  Do not feel he will need follow up PT at d/c.         Recommendations for follow up therapy are one component of a multi-disciplinary discharge planning process, led by the attending physician.  Recommendations may be updated based on patient status, additional functional criteria and insurance authorization.  Follow Up Recommendations No PT follow up      Assistance Recommended at Discharge Intermittent Supervision/Assistance  Patient can return home with the following  Assist for transportation;Direct supervision/assist for medications management;Direct supervision/assist for financial management    Equipment Recommendations None recommended by PT  Recommendations for Other Services       Functional Status Assessment Patient has had a recent decline in their functional status and demonstrates the ability to make significant improvements in function in a reasonable and predictable amount of time.     Precautions / Restrictions Precautions Precaution Comments: L inattention      Mobility  Bed Mobility                General bed mobility comments: up in recliner    Transfers Overall transfer level: Needs assistance Equipment used: None Transfers: Sit to/from Stand Sit to Stand: Supervision           General transfer comment: assist for lines    Ambulation/Gait Ambulation/Gait assistance: Min guard, Supervision Gait Distance (Feet): 300 Feet Assistive device: None Gait Pattern/deviations: Step-through pattern       General Gait Details: cues throughout for pathfinding, assist initially for safety, but progressed to supervision, managed to maneuver around obstacles well, but seemed to need some increased time for L side turns/obstacles  Stairs Stairs: Yes Stairs assistance: Supervision, Min guard Stair Management: One rail Right, Step to pattern, Alternating pattern, Forwards Number of Stairs: 10 General stair comments: step through to ascend, step to to descend and slower pace  Wheelchair Mobility    Modified Rankin (Stroke Patients Only) Modified Rankin (Stroke Patients Only) Pre-Morbid Rankin Score: No symptoms Modified Rankin: Slight disability     Balance Overall balance assessment: Needs assistance   Sitting balance-Leahy Scale: Good     Standing balance support: No upper extremity supported Standing balance-Leahy Scale: Good Standing balance comment: standing 10s eyes closed no LOB, able to look behind on both sides no LOB                             Pertinent Vitals/Pain Pain Assessment Pain Assessment: No/denies pain    Home Living Family/patient expects to be discharged to:: Private residence Living Arrangements: Spouse/significant other Available Help at Discharge: Available 24  hours/day Type of Home: House Home Access: Level entry     Alternate Level Stairs-Number of Steps: flight Home Layout: Two level;Bed/bath upstairs Home Equipment: None      Prior Function Prior Level of Function : Independent/Modified Independent;Driving              Mobility Comments: no AD ADLs Comments: drives, IADLs indep     Hand Dominance   Dominant Hand: Right    Extremity/Trunk Assessment   Upper Extremity Assessment Upper Extremity Assessment: Defer to OT evaluation    Lower Extremity Assessment Lower Extremity Assessment: Overall WFL for tasks assessed       Communication   Communication: No difficulties  Cognition Arousal/Alertness: Awake/alert Behavior During Therapy: Flat affect Overall Cognitive Status: Impaired/Different from baseline Area of Impairment: Problem solving, Safety/judgement, Attention                   Current Attention Level: Selective     Safety/Judgement: Decreased awareness of deficits   Problem Solving: Slow processing General Comments: difficulty with attention to therapist in the room when reading stroke booklet and staff from admissions in the room to get updated information from family        General Comments General comments (skin integrity, edema, etc.): VSS on RA, wife and daughter in the room    Exercises     Assessment/Plan    PT Assessment Patient needs continued PT services  PT Problem List Decreased safety awareness;Decreased cognition       PT Treatment Interventions Gait training;Balance training;Stair training;Functional mobility training;Therapeutic activities;Patient/family education    PT Goals (Current goals can be found in the Care Plan section)  Acute Rehab PT Goals Patient Stated Goal: to return to independent PT Goal Formulation: With patient/family Time For Goal Achievement: 04/27/22 Potential to Achieve Goals: Good    Frequency Min 4X/week     Co-evaluation               AM-PAC PT "6 Clicks" Mobility  Outcome Measure Help needed turning from your back to your side while in a flat bed without using bedrails?: None Help needed moving from lying on your back to sitting on the side of a flat bed without using bedrails?: None Help  needed moving to and from a bed to a chair (including a wheelchair)?: A Little Help needed standing up from a chair using your arms (e.g., wheelchair or bedside chair)?: A Little Help needed to walk in hospital room?: A Little Help needed climbing 3-5 steps with a railing? : A Little 6 Click Score: 20    End of Session Equipment Utilized During Treatment: Gait belt Activity Tolerance: Patient tolerated treatment well Patient left: in chair;with call bell/phone within reach;with family/visitor present;with chair alarm set   PT Visit Diagnosis: Other symptoms and signs involving the nervous system (R29.898)    [id number] PT Time Calculation (min) (ACUTE ONLY): 21 min   Charges:   PT Evaluation $PT Eval Low Complexity: 1 Low          Magda Kiel, PT Acute Rehabilitation Services [phone] 04/13/2022   Reginia Naas 04/13/2022, 2:21 PM

## 2022-04-13 NOTE — Progress Notes (Signed)
OT Cancellation Note  Patient Details Name: Alexander Stewart MRN: [id number] DOB: 1946-04-19   Cancelled Treatment:    Reason Eval/Treat Not Completed: Active bedrest order (OT evaluation to f/u as activity orders progress)  Elliot Cousin 04/13/2022, 7:40 AM

## 2022-04-13 NOTE — Progress Notes (Addendum)
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 He presented to the ED via EMS as a Code Stroke with acute onset of left-sided weakness. 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 He was within the window for TNK therapy and this was administered. 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   Subjective: Patient sitting up in bed eating his breakfast. He denies any weakness or coordination deficits in his arms or legs. He denies pain or discomfort.   Allergies: Patient has no known allergies.  Medications: Prior to Admission medications   Medication Sig Start Date End Date Taking? Authorizing Provider  Cholecalciferol (VITAMIN D3 PO) Take 1 capsule by mouth daily.   Yes [provider]  lovastatin (MEVACOR) 10 MG tablet Take 20 mg by mouth daily. 02/15/22  Yes [provider]  metoprolol succinate (TOPROL-XL) 25 MG 24 hr tablet Take 25 mg by mouth daily. 04/09/22  Yes [provider]  Multiple Vitamin (MULTIVITAMIN WITH MINERALS) TABS tablet Take 1 tablet by mouth daily.   Yes [provider]  vitamin B-12 (CYANOCOBALAMIN) 100 MCG tablet Take 100 mcg by mouth daily.   Yes [provider]  vitamin E 1000 UNIT capsule Take 1,000 Units by mouth daily.   Yes [provider]     Vital Signs: BP 127/74 (BP Location: Left Arm)   Pulse 79    Temp 97.6 F (36.4 C) (Oral)   Resp 12   Ht 5' 7.5" (1.715 m)   Wt 160 lb 0.9 oz (72.6 kg)   SpO2 98%   BMI 24.70 kg/m   Physical Exam Constitutional:      General: He is not in acute distress.    Appearance: He is ill-appearing.  HENT:     Mouth/Throat:     Mouth: Mucous membranes are moist.     Pharynx: Oropharynx is clear.  Eyes:     General: No visual field deficit. Cardiovascular:     Rate and Rhythm: Normal rate and regular rhythm.     Comments: Right groin vascular site is soft, dry and non-tender. The dressing has old, dried blood. Mild ecchymosis around the site.  Pulmonary:     Effort: Pulmonary effort is normal.  Skin:    General: Skin is warm and dry.  Neurological:     Mental Status: He is alert and oriented to person, place, and time.     Cranial Nerves: No dysarthria or facial asymmetry.     Motor: Tremor present. No weakness.     Coordination: Coordination is intact.     Comments: Patient states he has a history of tremors    Imaging: IR PERCUTANEOUS ART THROMBECTOMY/INFUSION INTRACRANIAL INC DIAG ANGIO  Result Date: 04/13/2022 INDICATION: 76 year old male with past medical history significant for emphysema, hypertension, thyroid disease and prior traumatic intracranial hemorrhage; baseline modified Rankin scale 0. He presented to emergency with acute onset left-sided weakness; NIHSS 10. Head CT showed ongoing ischemia in the right insula (ASPECTS 10). CT angiogram of the head  and neck showed a cervical right ICA occlusion at the bulb with tandem occlusion of the M1/MCA. Patient received intravenous TNK and was transferred to our service for mechanical thrombectomy. EXAM: ULTRASOUND-GUIDED VASCULAR ACCESS DIAGNOSTIC CEREBRAL ANGIOGRAM MECHANICAL THROMBECTOMY CERVICAL RIGHT CAROTID STENTING WITH CEREBRAL PROTECTION DEVICE INTRACRANIAL STENTING FLAT PANEL HEAD CT (X2) COMPARISON:  CT/CT angiogram of the head and neck April 12, 2022. MEDICATIONS: No antibiotics  administered. ANESTHESIA/SEDATION: The procedure was performed under general anesthesia. CONTRAST:  Are and 25 mL of Omnipaque 300 mg/mL FLUOROSCOPY: Radiation Exposure Index (as provided by the fluoroscopic device): 4,967 mGy Kerma COMPLICATIONS: None immediate. TECHNIQUE: Informed written consent was obtained from the patient's wife after a thorough discussion of the procedural risks, benefits and alternatives. All questions were addressed. Maximal Sterile Barrier Technique was utilized including caps, mask, sterile gowns, sterile gloves, sterile drape, hand hygiene and skin antiseptic. A timeout was performed prior to the initiation of the procedure. The right groin was prepped and draped in the usual sterile fashion. Using a micropuncture kit and the modified Seldinger technique, access was gained to the right common femoral artery and an 8 French sheath was placed. Real-time ultrasound guidance was utilized for vascular access including the acquisition of a permanent ultrasound image documenting patency of the accessed vessel. Under fluoroscopy, a Zoom 88 support guide catheter was navigated over a 6 [place] Berenstein 2 catheter and a 0.035" Terumo Glidewire into the aortic arch. The catheter was placed into the right common carotid artery. Frontal and lateral angiograms of the neck were obtained. The Berenstein 2 catheter was removed. Using biplane roadmap guidance, attempted navigation of a zoom 71 aspiration catheter over a phenom 21 microcatheter and a Aristotle 14 microguidewire into the upper cervical segment of the right ICA proved unsuccessful. The microcatheter and aspiration catheter were removed and the Berenstein 2 catheter was again navigated through the zoom 88 guide catheter and over a 0.035" Terumo Glidewire into the right common carotid artery. The Berenstein 2 catheter was then advanced over the wire into the distal cervical segment of the right ICA. The guide catheter was then advanced into  the distal cervical segment of the right ICA. The diagnostic catheter was removed. Frontal and lateral angiograms of the head were obtained. FINDINGS: 1. Normal caliber of the right common femoral artery, adequate for vascular access. 2. 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 3. Atherosclerotic changes along the petrous and cavernous segment of the right internal carotid artery without hemodynamically significant stenosis. 4. 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 PROCEDURE: Using biplane roadmap guidance, a zoom 71 aspiration catheter was navigated over a phenom 21 microcatheter and a Aristotle 14 microguidewire into the cavernous segment of the right ICA. The microcatheter was then navigated over the wire into the right M2/MCA posterior division branch. Then, a 4 x 40 mm solitaire stent retriever was deployed spanning the M1 and M2 segment. The device was allowed to intercalated with the clot for 4 minutes. The microcatheter was removed. The aspiration catheter was advanced to the level of occlusion and connected to an aspiration pump. The thrombectomy device and aspiration catheter were removed under constant aspiration. Right internal carotid artery angiograms with frontal, lateral, magnified right anterior oblique and magnified lateral views of the head were obtained showing complete recanalization of the right MCA vascular tree with slow flow in distal cortical branches (TICI 2C).  The guide catheter was retracted into the right common carotid artery. Frontal and lateral angiograms of the neck were obtained. 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 Flat panel CT of the head was obtained and post processed in a separate workstation with concurrent attending physician supervision.  Selected images were sent to PACS. No evidence of hemorrhagic complication. At this point, patient was loaded on Cangrelor followed by continuous drip. Right common carotid artery angiograms with frontal and lateral views of the head were obtained and used as biplane roadmap. Then, a 2.5-4.8 mm Emboshield NAV 6 cerebral protection device was navigated and deployed into the distal cervical segment of the right ICA. Then, a 4 x 30 mm Viatrac balloon was navigated into the right carotid bulb. Angioplasty was performed under fluoroscopy. The balloon was then removed. Then, a 8-6 x 40 mm XACT carotid stent was deployed spanning the distal right common carotid artery and proximal right internal carotid artery, across the stenosis. In stent angioplasty was then performed with a 6 x 20 mm Viatrac balloon. The cerebral protection device was then recaptured. Right internal carotid artery angiogram with frontal and lateral views of the neck showed adequate stent positioning with no residual stenosis or in stent clot formation. A nonocclusive filling defect is noted in the petrous segment of the right ICA. Right internal carotid artery angiogram with frontal and lateral views of the head showed occlusion of the right M3/MCA superior division branch. Using biplane roadmap guidance, a zoom 71 aspiration catheter was navigated over a phenom 21 microcatheter and a Aristotle 14 microguidewire into the cavernous segment of the right ICA. The microcatheter was then navigated over the wire into the right M3 segment. Then, a 3 x 20 mm solitaire stent retriever was deployed spanning the M3 segment. The device was allowed to intercalated with the clot for 4 minutes. The microcatheter was removed. The aspiration catheter was advanced to the M1 segment and connected to an aspiration pump. The thrombectomy device and aspiration catheter were removed under constant aspiration. Right internal carotid artery angiograms with frontal, lateral,  magnified right anterior oblique and magnified lateral views of the head showed recanalization of the M3 branch. A dissection flap is noted in the cavernous segment. Delayed frontal and lateral angiograms of the head showed nonocclusive clot formation at the level of the dissection. Using biplane roadmap guidance, an SL 10 microcatheter was navigated over an Aristotle 14 micro guidewire into the cavernous segment of the right ACA. Then, a 4 x 24 mm neuroform atlas stent was navigated and deployed in the petrous segment, covering the area of dissection. Magnified frontal and lateral angiograms of the head showed adequate stent positioning covering the dissected segment. Frontal and lateral angiograms of the head showed no evidence of thromboembolic complication with complete opacification of the right MCA vascular tree with slow flow in distal cortical branches (TICI 2C). The guide catheter was retracted into the right common carotid artery. Magnified frontal and lateral angiograms of the neck were obtained, centered on the recently wall stent. The stent is widely open without residual stenosis or evidence of in stent clot formation. The catheter was subsequently withdrawn. Flat panel CT of the head was obtained and post processed in a separate workstation with concurrent attending physician supervision. Selected images were sent to PACS. Small hyperdensity is noted in the right sylvian fissure and in the right high convexity, may represent subarachnoid hemorrhage versus contrast extravasation. Right  common femoral artery angiogram was obtained in right anterior oblique view. The puncture is at the level of the common femoral artery. The artery has mild atherosclerotic changes without significant stenosis, adequate for closure device. The sheath was exchanged over the wire for a Perclose prostyle which was utilized for access closure. Immediate hemostasis was achieved. IMPRESSION: 1. Successful mechanical  thrombectomy for treatment of a right M1/MCA occlusion with complete recanalization (TICI 2C). 2. Right carotid bifurcation angioplasty and stenting without residual stenosis. 3. 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 PLAN: Transfer to ICU. Follow-up CT in 3 hours to evaluate for SAH progression. Electronically Signed   By: Pedro Earls M.D.   On: 04/13/2022 10:59   IR US Guide Vasc Access Right  Result Date: 04/13/2022 INDICATION: 76 year old male with past medical history significant for emphysema, hypertension, thyroid disease and prior traumatic intracranial hemorrhage; baseline modified Rankin scale 0. He presented to emergency with acute onset left-sided weakness; NIHSS 10. Head CT showed ongoing ischemia in the right insula (ASPECTS 10). 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 Patient received intravenous TNK and was transferred to our service for mechanical thrombectomy. EXAM: ULTRASOUND-GUIDED VASCULAR ACCESS DIAGNOSTIC CEREBRAL ANGIOGRAM MECHANICAL THROMBECTOMY CERVICAL RIGHT CAROTID STENTING WITH CEREBRAL PROTECTION DEVICE INTRACRANIAL STENTING FLAT PANEL HEAD CT (X2) COMPARISON:  CT/CT angiogram of the head and neck April 12, 2022. MEDICATIONS: No antibiotics administered. ANESTHESIA/SEDATION: The procedure was performed under general anesthesia. CONTRAST:  Are and 25 mL of Omnipaque 300 mg/mL FLUOROSCOPY: Radiation Exposure Index (as provided by the fluoroscopic device): 2,229 mGy Kerma COMPLICATIONS: None immediate. TECHNIQUE: Informed written consent was obtained from the patient's wife after a thorough discussion of the procedural risks, benefits and alternatives. All questions were addressed. Maximal Sterile Barrier Technique was utilized including caps, mask, sterile gowns, sterile gloves, sterile drape, hand hygiene and skin  antiseptic. A timeout was performed prior to the initiation of the procedure. The right groin was prepped and draped in the usual sterile fashion. Using a micropuncture kit and the modified Seldinger technique, access was gained to the right common femoral artery and an 8 French sheath was placed. Real-time ultrasound guidance was utilized for vascular access including the acquisition of a permanent ultrasound image documenting patency of the accessed vessel. Under fluoroscopy, a Zoom 88 support guide catheter was navigated over a 6 [place] Berenstein 2 catheter and a 0.035" Terumo Glidewire into the aortic arch. The catheter was placed into the right common carotid artery. Frontal and lateral angiograms of the neck were obtained. The Berenstein 2 catheter was removed. Using biplane roadmap guidance, attempted navigation of a zoom 71 aspiration catheter over a phenom 21 microcatheter and a Aristotle 14 microguidewire into the upper cervical segment of the right ICA proved unsuccessful. The microcatheter and aspiration catheter were removed and the Berenstein 2 catheter was again navigated through the zoom 88 guide catheter and over a 0.035" Terumo Glidewire into the right common carotid artery. The Berenstein 2 catheter was then advanced over the wire into the distal cervical segment of the right ICA. The guide catheter was then advanced into the distal cervical segment of the right ICA. The diagnostic catheter was removed. Frontal and lateral angiograms of the head were obtained. FINDINGS: 1. Normal caliber of the right common femoral artery, adequate for vascular access. 2. Atherosclerotic changes of the right carotid bulb with  occlusion of the cervical right internal carotid artery at the bifurcation. 3. Atherosclerotic changes along the petrous and cavernous segment of the right internal carotid artery without hemodynamically significant stenosis. 4. Occlusion of the mid right M1/MCA just distal to the origin of  the anterior temporal branch. PROCEDURE: Using biplane roadmap guidance, a zoom 71 aspiration catheter was navigated over a phenom 21 microcatheter and a Aristotle 14 microguidewire into the cavernous segment of the right ICA. The microcatheter was then navigated over the wire into the right M2/MCA posterior division branch. Then, a 4 x 40 mm solitaire stent retriever was deployed spanning the M1 and M2 segment. The device was allowed to intercalated with the clot for 4 minutes. The microcatheter was removed. The aspiration catheter was advanced to the level of occlusion and connected to an aspiration pump. The thrombectomy device and aspiration catheter were removed under constant aspiration. Right internal carotid artery angiograms with frontal, lateral, magnified right anterior oblique and magnified lateral views of the head were obtained showing complete recanalization of the right MCA vascular tree with slow flow in distal cortical branches (TICI 2C). The guide catheter was retracted into the right common carotid artery. Frontal and lateral angiograms of the neck were obtained. 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 Flat panel CT of the head was obtained and post processed in a separate workstation with concurrent attending physician supervision. Selected images were sent to PACS. No evidence of hemorrhagic complication. At this point, patient was loaded on Cangrelor followed by continuous drip. Right common carotid artery angiograms with frontal and lateral views of the head were obtained and used as biplane roadmap. Then, a 2.5-4.8 mm Emboshield NAV 6 cerebral protection device was navigated and deployed into the distal cervical segment of the right ICA. Then, a 4 x 30 mm Viatrac balloon was navigated into the right carotid bulb. Angioplasty was performed under fluoroscopy. The balloon was then removed. Then, a 8-6 x  40 mm XACT carotid stent was deployed spanning the distal right common carotid artery and proximal right internal carotid artery, across the stenosis. In stent angioplasty was then performed with a 6 x 20 mm Viatrac balloon. The cerebral protection device was then recaptured. Right internal carotid artery angiogram with frontal and lateral views of the neck showed adequate stent positioning with no residual stenosis or in stent clot formation. A nonocclusive filling defect is noted in the petrous segment of the right ICA. Right internal carotid artery angiogram with frontal and lateral views of the head showed occlusion of the right M3/MCA superior division branch. Using biplane roadmap guidance, a zoom 71 aspiration catheter was navigated over a phenom 21 microcatheter and a Aristotle 14 microguidewire into the cavernous segment of the right ICA. The microcatheter was then navigated over the wire into the right M3 segment. Then, a 3 x 20 mm solitaire stent retriever was deployed spanning the M3 segment. The device was allowed to intercalated with the clot for 4 minutes. The microcatheter was removed. The aspiration catheter was advanced to the M1 segment and connected to an aspiration pump. The thrombectomy device and aspiration catheter were removed under constant aspiration. Right internal carotid artery angiograms with frontal, lateral, magnified right anterior oblique and magnified lateral views of the head showed recanalization of the M3 branch. A dissection flap is noted in the cavernous segment. Delayed frontal and lateral angiograms of the head showed nonocclusive clot formation at the level of  the dissection. Using biplane roadmap guidance, an SL 10 microcatheter was navigated over an Aristotle 14 micro guidewire into the cavernous segment of the right ACA. Then, a 4 x 24 mm neuroform atlas stent was navigated and deployed in the petrous segment, covering the area of dissection. Magnified frontal and  lateral angiograms of the head showed adequate stent positioning covering the dissected segment. Frontal and lateral angiograms of the head showed no evidence of thromboembolic complication with complete opacification of the right MCA vascular tree with slow flow in distal cortical branches (TICI 2C). The guide catheter was retracted into the right common carotid artery. Magnified frontal and lateral angiograms of the neck were obtained, centered on the recently wall stent. The stent is widely open without residual stenosis or evidence of in stent clot formation. The catheter was subsequently withdrawn. Flat panel CT of the head was obtained and post processed in a separate workstation with concurrent attending physician supervision. Selected images were sent to PACS. Small hyperdensity is noted in the right sylvian fissure and in the right high convexity, may represent subarachnoid hemorrhage versus contrast extravasation. Right common femoral artery angiogram was obtained in right anterior oblique view. The puncture is at the level of the common femoral artery. The artery has mild atherosclerotic changes without significant stenosis, adequate for closure device. The sheath was exchanged over the wire for a Perclose prostyle which was utilized for access closure. Immediate hemostasis was achieved. IMPRESSION: 1. Successful mechanical thrombectomy for treatment of a right M1/MCA occlusion with complete recanalization (TICI 2C). 2. Right carotid bifurcation angioplasty and stenting without residual stenosis. 3. 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 PLAN: Transfer to ICU. Follow-up CT in 3 hours to evaluate for SAH progression. Electronically Signed   By: Pedro Earls M.D.   On: 04/13/2022 10:59   IR CT Head Ltd  Result Date: 04/13/2022 INDICATION: 76 year old male with past medical history significant for emphysema, hypertension,  thyroid disease and prior traumatic intracranial hemorrhage; baseline modified Rankin scale 0. He presented to emergency with acute onset left-sided weakness; NIHSS 10. Head CT showed ongoing ischemia in the right insula (ASPECTS 10). 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 Patient received intravenous TNK and was transferred to our service for mechanical thrombectomy. EXAM: ULTRASOUND-GUIDED VASCULAR ACCESS DIAGNOSTIC CEREBRAL ANGIOGRAM MECHANICAL THROMBECTOMY CERVICAL RIGHT CAROTID STENTING WITH CEREBRAL PROTECTION DEVICE INTRACRANIAL STENTING FLAT PANEL HEAD CT (X2) COMPARISON:  CT/CT angiogram of the head and neck April 12, 2022. MEDICATIONS: No antibiotics administered. ANESTHESIA/SEDATION: The procedure was performed under general anesthesia. CONTRAST:  Are and 25 mL of Omnipaque 300 mg/mL FLUOROSCOPY: Radiation Exposure Index (as provided by the fluoroscopic device): 9,628 mGy Kerma COMPLICATIONS: None immediate. TECHNIQUE: Informed written consent was obtained from the patient's wife after a thorough discussion of the procedural risks, benefits and alternatives. All questions were addressed. Maximal Sterile Barrier Technique was utilized including caps, mask, sterile gowns, sterile gloves, sterile drape, hand hygiene and skin antiseptic. A timeout was performed prior to the initiation of the procedure. The right groin was prepped and draped in the usual sterile fashion. Using a micropuncture kit and the modified Seldinger technique, access was gained to the right common femoral artery and an 8 French sheath was placed. Real-time ultrasound guidance was utilized for vascular access including the acquisition of a permanent ultrasound image documenting patency of the accessed  vessel. Under fluoroscopy, a Zoom 88 support guide catheter was navigated over a 6 [place] Berenstein 2 catheter and a 0.035" Terumo Glidewire into the  aortic arch. The catheter was placed into the right common carotid artery. Frontal and lateral angiograms of the neck were obtained. The Berenstein 2 catheter was removed. Using biplane roadmap guidance, attempted navigation of a zoom 71 aspiration catheter over a phenom 21 microcatheter and a Aristotle 14 microguidewire into the upper cervical segment of the right ICA proved unsuccessful. The microcatheter and aspiration catheter were removed and the Berenstein 2 catheter was again navigated through the zoom 88 guide catheter and over a 0.035" Terumo Glidewire into the right common carotid artery. The Berenstein 2 catheter was then advanced over the wire into the distal cervical segment of the right ICA. The guide catheter was then advanced into the distal cervical segment of the right ICA. The diagnostic catheter was removed. Frontal and lateral angiograms of the head were obtained. FINDINGS: 1. Normal caliber of the right common femoral artery, adequate for vascular access. 2. 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 3. Atherosclerotic changes along the petrous and cavernous segment of the right internal carotid artery without hemodynamically significant stenosis. 4. 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 PROCEDURE: Using biplane roadmap guidance, a zoom 71 aspiration catheter was navigated over a phenom 21 microcatheter and a Aristotle 14 microguidewire into the cavernous segment of the right ICA. The microcatheter was then navigated over the wire into the right M2/MCA posterior division branch. Then, a 4 x 40 mm solitaire stent retriever was deployed spanning the M1 and M2 segment. The device was allowed to intercalated with the clot for 4 minutes. The microcatheter was removed. The aspiration catheter was advanced to the level of occlusion and connected to an  aspiration pump. The thrombectomy device and aspiration catheter were removed under constant aspiration. Right internal carotid artery angiograms with frontal, lateral, magnified right anterior oblique and magnified lateral views of the head were obtained showing complete recanalization of the right MCA vascular tree with slow flow in distal cortical branches (TICI 2C). The guide catheter was retracted into the right common carotid artery. Frontal and lateral angiograms of the neck were obtained. 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 Flat panel CT of the head was obtained and post processed in a separate workstation with concurrent attending physician supervision. Selected images were sent to PACS. No evidence of hemorrhagic complication. At this point, patient was loaded on Cangrelor followed by continuous drip. Right common carotid artery angiograms with frontal and lateral views of the head were obtained and used as biplane roadmap. Then, a 2.5-4.8 mm Emboshield NAV 6 cerebral protection device was navigated and deployed into the distal cervical segment of the right ICA. Then, a 4 x 30 mm Viatrac balloon was navigated into the right carotid bulb. Angioplasty was performed under fluoroscopy. The balloon was then removed. Then, a 8-6 x 40 mm XACT carotid stent was deployed spanning the distal right common carotid artery and proximal right internal carotid artery, across the stenosis. In stent angioplasty was then performed with a 6 x 20 mm Viatrac balloon. The cerebral protection device was then recaptured. Right internal carotid artery angiogram with frontal and lateral views of the neck showed adequate stent positioning with no residual stenosis or  in stent clot formation. A nonocclusive filling defect is noted in the petrous segment of the right ICA. Right internal carotid artery angiogram with frontal and lateral views  of the head showed occlusion of the right M3/MCA superior division branch. Using biplane roadmap guidance, a zoom 71 aspiration catheter was navigated over a phenom 21 microcatheter and a Aristotle 14 microguidewire into the cavernous segment of the right ICA. The microcatheter was then navigated over the wire into the right M3 segment. Then, a 3 x 20 mm solitaire stent retriever was deployed spanning the M3 segment. The device was allowed to intercalated with the clot for 4 minutes. The microcatheter was removed. The aspiration catheter was advanced to the M1 segment and connected to an aspiration pump. The thrombectomy device and aspiration catheter were removed under constant aspiration. Right internal carotid artery angiograms with frontal, lateral, magnified right anterior oblique and magnified lateral views of the head showed recanalization of the M3 branch. A dissection flap is noted in the cavernous segment. Delayed frontal and lateral angiograms of the head showed nonocclusive clot formation at the level of the dissection. Using biplane roadmap guidance, an SL 10 microcatheter was navigated over an Aristotle 14 micro guidewire into the cavernous segment of the right ACA. Then, a 4 x 24 mm neuroform atlas stent was navigated and deployed in the petrous segment, covering the area of dissection. Magnified frontal and lateral angiograms of the head showed adequate stent positioning covering the dissected segment. Frontal and lateral angiograms of the head showed no evidence of thromboembolic complication with complete opacification of the right MCA vascular tree with slow flow in distal cortical branches (TICI 2C). The guide catheter was retracted into the right common carotid artery. Magnified frontal and lateral angiograms of the neck were obtained, centered on the recently wall stent. The stent is widely open without residual stenosis or evidence of in stent clot formation. The catheter was subsequently  withdrawn. Flat panel CT of the head was obtained and post processed in a separate workstation with concurrent attending physician supervision. Selected images were sent to PACS. Small hyperdensity is noted in the right sylvian fissure and in the right high convexity, may represent subarachnoid hemorrhage versus contrast extravasation. Right common femoral artery angiogram was obtained in right anterior oblique view. The puncture is at the level of the common femoral artery. The artery has mild atherosclerotic changes without significant stenosis, adequate for closure device. The sheath was exchanged over the wire for a Perclose prostyle which was utilized for access closure. Immediate hemostasis was achieved. IMPRESSION: 1. Successful mechanical thrombectomy for treatment of a right M1/MCA occlusion with complete recanalization (TICI 2C). 2. Right carotid bifurcation angioplasty and stenting without residual stenosis. 3. 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 PLAN: Transfer to ICU. Follow-up CT in 3 hours to evaluate for SAH progression. Electronically Signed   By: Pedro Earls M.D.   On: 04/13/2022 10:59   IR INTRAVSC STENT CERV CAROTID W/EMB-PROT MOD SED  Result Date: 04/13/2022 INDICATION: 76 year old male with past medical history significant for emphysema, hypertension, thyroid disease and prior traumatic intracranial hemorrhage; baseline modified Rankin scale 0. He presented to emergency with acute onset left-sided weakness; NIHSS 10. Head CT showed ongoing ischemia in the right insula (ASPECTS 10). 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 Patient received intravenous TNK and was transferred to  our service for mechanical thrombectomy. EXAM: ULTRASOUND-GUIDED VASCULAR ACCESS DIAGNOSTIC CEREBRAL ANGIOGRAM MECHANICAL THROMBECTOMY CERVICAL RIGHT  CAROTID STENTING WITH CEREBRAL PROTECTION DEVICE INTRACRANIAL STENTING FLAT PANEL HEAD CT (X2) COMPARISON:  CT/CT angiogram of the head and neck April 12, 2022. MEDICATIONS: No antibiotics administered. ANESTHESIA/SEDATION: The procedure was performed under general anesthesia. CONTRAST:  Are and 25 mL of Omnipaque 300 mg/mL FLUOROSCOPY: Radiation Exposure Index (as provided by the fluoroscopic device): 5,638 mGy Kerma COMPLICATIONS: None immediate. TECHNIQUE: Informed written consent was obtained from the patient's wife after a thorough discussion of the procedural risks, benefits and alternatives. All questions were addressed. Maximal Sterile Barrier Technique was utilized including caps, mask, sterile gowns, sterile gloves, sterile drape, hand hygiene and skin antiseptic. A timeout was performed prior to the initiation of the procedure. The right groin was prepped and draped in the usual sterile fashion. Using a micropuncture kit and the modified Seldinger technique, access was gained to the right common femoral artery and an 8 French sheath was placed. Real-time ultrasound guidance was utilized for vascular access including the acquisition of a permanent ultrasound image documenting patency of the accessed vessel. Under fluoroscopy, a Zoom 88 support guide catheter was navigated over a 6 [place] Berenstein 2 catheter and a 0.035" Terumo Glidewire into the aortic arch. The catheter was placed into the right common carotid artery. Frontal and lateral angiograms of the neck were obtained. The Berenstein 2 catheter was removed. Using biplane roadmap guidance, attempted navigation of a zoom 71 aspiration catheter over a phenom 21 microcatheter and a Aristotle 14 microguidewire into the upper cervical segment of the right ICA proved unsuccessful. The microcatheter and aspiration catheter were removed and the Berenstein 2 catheter was again navigated through the zoom 88 guide catheter and over a 0.035" Terumo  Glidewire into the right common carotid artery. The Berenstein 2 catheter was then advanced over the wire into the distal cervical segment of the right ICA. The guide catheter was then advanced into the distal cervical segment of the right ICA. The diagnostic catheter was removed. Frontal and lateral angiograms of the head were obtained. FINDINGS: 1. Normal caliber of the right common femoral artery, adequate for vascular access. 2. 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 3. Atherosclerotic changes along the petrous and cavernous segment of the right internal carotid artery without hemodynamically significant stenosis. 4. 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 PROCEDURE: Using biplane roadmap guidance, a zoom 71 aspiration catheter was navigated over a phenom 21 microcatheter and a Aristotle 14 microguidewire into the cavernous segment of the right ICA. The microcatheter was then navigated over the wire into the right M2/MCA posterior division branch. Then, a 4 x 40 mm solitaire stent retriever was deployed spanning the M1 and M2 segment. The device was allowed to intercalated with the clot for 4 minutes. The microcatheter was removed. The aspiration catheter was advanced to the level of occlusion and connected to an aspiration pump. The thrombectomy device and aspiration catheter were removed under constant aspiration. Right internal carotid artery angiograms with frontal, lateral, magnified right anterior oblique and magnified lateral views of the head were obtained showing complete recanalization of the right MCA vascular tree with slow flow in distal cortical branches (TICI 2C). The guide catheter was retracted into the right common carotid artery. Frontal and lateral angiograms of the neck were obtained. Atherosclerotic changes of the right  carotid bifurcation with  high-grade stenosis, plaque ulceration and clot formation was noted. Flat panel CT of the head was obtained and post processed in a separate workstation with concurrent attending physician supervision. Selected images were sent to PACS. No evidence of hemorrhagic complication. At this point, patient was loaded on Cangrelor followed by continuous drip. Right common carotid artery angiograms with frontal and lateral views of the head were obtained and used as biplane roadmap. Then, a 2.5-4.8 mm Emboshield NAV 6 cerebral protection device was navigated and deployed into the distal cervical segment of the right ICA. Then, a 4 x 30 mm Viatrac balloon was navigated into the right carotid bulb. Angioplasty was performed under fluoroscopy. The balloon was then removed. Then, a 8-6 x 40 mm XACT carotid stent was deployed spanning the distal right common carotid artery and proximal right internal carotid artery, across the stenosis. In stent angioplasty was then performed with a 6 x 20 mm Viatrac balloon. The cerebral protection device was then recaptured. Right internal carotid artery angiogram with frontal and lateral views of the neck showed adequate stent positioning with no residual stenosis or in stent clot formation. A nonocclusive filling defect is noted in the petrous segment of the right ICA. Right internal carotid artery angiogram with frontal and lateral views of the head showed occlusion of the right M3/MCA superior division branch. Using biplane roadmap guidance, a zoom 71 aspiration catheter was navigated over a phenom 21 microcatheter and a Aristotle 14 microguidewire into the cavernous segment of the right ICA. The microcatheter was then navigated over the wire into the right M3 segment. Then, a 3 x 20 mm solitaire stent retriever was deployed spanning the M3 segment. The device was allowed to intercalated with the clot for 4 minutes. The microcatheter was removed. The aspiration catheter was advanced to the  M1 segment and connected to an aspiration pump. The thrombectomy device and aspiration catheter were removed under constant aspiration. Right internal carotid artery angiograms with frontal, lateral, magnified right anterior oblique and magnified lateral views of the head showed recanalization of the M3 branch. A dissection flap is noted in the cavernous segment. Delayed frontal and lateral angiograms of the head showed nonocclusive clot formation at the level of the dissection. Using biplane roadmap guidance, an SL 10 microcatheter was navigated over an Aristotle 14 micro guidewire into the cavernous segment of the right ACA. Then, a 4 x 24 mm neuroform atlas stent was navigated and deployed in the petrous segment, covering the area of dissection. Magnified frontal and lateral angiograms of the head showed adequate stent positioning covering the dissected segment. Frontal and lateral angiograms of the head showed no evidence of thromboembolic complication with complete opacification of the right MCA vascular tree with slow flow in distal cortical branches (TICI 2C). The guide catheter was retracted into the right common carotid artery. Magnified frontal and lateral angiograms of the neck were obtained, centered on the recently wall stent. The stent is widely open without residual stenosis or evidence of in stent clot formation. The catheter was subsequently withdrawn. Flat panel CT of the head was obtained and post processed in a separate workstation with concurrent attending physician supervision. Selected images were sent to PACS. Small hyperdensity is noted in the right sylvian fissure and in the right high convexity, may represent subarachnoid hemorrhage versus contrast extravasation. Right common femoral artery angiogram was obtained in right anterior oblique view. The puncture is at the level of the common femoral artery. The artery has  mild atherosclerotic changes without significant stenosis, adequate for  closure device. The sheath was exchanged over the wire for a Perclose prostyle which was utilized for access closure. Immediate hemostasis was achieved. IMPRESSION: 1. Successful mechanical thrombectomy for treatment of a right M1/MCA occlusion with complete recanalization (TICI 2C). 2. Right carotid bifurcation angioplasty and stenting without residual stenosis. 3. 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 PLAN: Transfer to ICU. Follow-up CT in 3 hours to evaluate for SAH progression. Electronically Signed   By: Pedro Earls M.D.   On: 04/13/2022 10:59   IR Intra Cran Stent  Result Date: 04/13/2022 INDICATION: 76 year old male with past medical history significant for emphysema, hypertension, thyroid disease and prior traumatic intracranial hemorrhage; baseline modified Rankin scale 0. He presented to emergency with acute onset left-sided weakness; NIHSS 10. Head CT showed ongoing ischemia in the right insula (ASPECTS 10). 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 Patient received intravenous TNK and was transferred to our service for mechanical thrombectomy. EXAM: ULTRASOUND-GUIDED VASCULAR ACCESS DIAGNOSTIC CEREBRAL ANGIOGRAM MECHANICAL THROMBECTOMY CERVICAL RIGHT CAROTID STENTING WITH CEREBRAL PROTECTION DEVICE INTRACRANIAL STENTING FLAT PANEL HEAD CT (X2) COMPARISON:  CT/CT angiogram of the head and neck April 12, 2022. MEDICATIONS: No antibiotics administered. ANESTHESIA/SEDATION: The procedure was performed under general anesthesia. CONTRAST:  Are and 25 mL of Omnipaque 300 mg/mL FLUOROSCOPY: Radiation Exposure Index (as provided by the fluoroscopic device): 5,573 mGy Kerma COMPLICATIONS: None immediate. TECHNIQUE: Informed written consent was obtained from the patient's wife after a thorough discussion of the procedural risks, benefits and  alternatives. All questions were addressed. Maximal Sterile Barrier Technique was utilized including caps, mask, sterile gowns, sterile gloves, sterile drape, hand hygiene and skin antiseptic. A timeout was performed prior to the initiation of the procedure. The right groin was prepped and draped in the usual sterile fashion. Using a micropuncture kit and the modified Seldinger technique, access was gained to the right common femoral artery and an 8 French sheath was placed. Real-time ultrasound guidance was utilized for vascular access including the acquisition of a permanent ultrasound image documenting patency of the accessed vessel. Under fluoroscopy, a Zoom 88 support guide catheter was navigated over a 6 [place] Berenstein 2 catheter and a 0.035" Terumo Glidewire into the aortic arch. The catheter was placed into the right common carotid artery. Frontal and lateral angiograms of the neck were obtained. The Berenstein 2 catheter was removed. Using biplane roadmap guidance, attempted navigation of a zoom 71 aspiration catheter over a phenom 21 microcatheter and a Aristotle 14 microguidewire into the upper cervical segment of the right ICA proved unsuccessful. The microcatheter and aspiration catheter were removed and the Berenstein 2 catheter was again navigated through the zoom 88 guide catheter and over a 0.035" Terumo Glidewire into the right common carotid artery. The Berenstein 2 catheter was then advanced over the wire into the distal cervical segment of the right ICA. The guide catheter was then advanced into the distal cervical segment of the right ICA. The diagnostic catheter was removed. Frontal and lateral angiograms of the head were obtained. FINDINGS: 1. Normal caliber of the right common femoral artery, adequate for vascular access. 2. 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 3. Atherosclerotic changes along  the petrous and cavernous segment of the right internal carotid artery  without hemodynamically significant stenosis. 4. 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 PROCEDURE: Using biplane roadmap guidance, a zoom 71 aspiration catheter was navigated over a phenom 21 microcatheter and a Aristotle 14 microguidewire into the cavernous segment of the right ICA. The microcatheter was then navigated over the wire into the right M2/MCA posterior division branch. Then, a 4 x 40 mm solitaire stent retriever was deployed spanning the M1 and M2 segment. The device was allowed to intercalated with the clot for 4 minutes. The microcatheter was removed. The aspiration catheter was advanced to the level of occlusion and connected to an aspiration pump. The thrombectomy device and aspiration catheter were removed under constant aspiration. Right internal carotid artery angiograms with frontal, lateral, magnified right anterior oblique and magnified lateral views of the head were obtained showing complete recanalization of the right MCA vascular tree with slow flow in distal cortical branches (TICI 2C). The guide catheter was retracted into the right common carotid artery. Frontal and lateral angiograms of the neck were obtained. 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 Flat panel CT of the head was obtained and post processed in a separate workstation with concurrent attending physician supervision. Selected images were sent to PACS. No evidence of hemorrhagic complication. At this point, patient was loaded on Cangrelor followed by continuous drip. Right common carotid artery angiograms with frontal and lateral views of the head were obtained and used as biplane roadmap. Then, a 2.5-4.8 mm Emboshield NAV 6 cerebral protection device was navigated and deployed into the distal cervical segment of  the right ICA. Then, a 4 x 30 mm Viatrac balloon was navigated into the right carotid bulb. Angioplasty was performed under fluoroscopy. The balloon was then removed. Then, a 8-6 x 40 mm XACT carotid stent was deployed spanning the distal right common carotid artery and proximal right internal carotid artery, across the stenosis. In stent angioplasty was then performed with a 6 x 20 mm Viatrac balloon. The cerebral protection device was then recaptured. Right internal carotid artery angiogram with frontal and lateral views of the neck showed adequate stent positioning with no residual stenosis or in stent clot formation. A nonocclusive filling defect is noted in the petrous segment of the right ICA. Right internal carotid artery angiogram with frontal and lateral views of the head showed occlusion of the right M3/MCA superior division branch. Using biplane roadmap guidance, a zoom 71 aspiration catheter was navigated over a phenom 21 microcatheter and a Aristotle 14 microguidewire into the cavernous segment of the right ICA. The microcatheter was then navigated over the wire into the right M3 segment. Then, a 3 x 20 mm solitaire stent retriever was deployed spanning the M3 segment. The device was allowed to intercalated with the clot for 4 minutes. The microcatheter was removed. The aspiration catheter was advanced to the M1 segment and connected to an aspiration pump. The thrombectomy device and aspiration catheter were removed under constant aspiration. Right internal carotid artery angiograms with frontal, lateral, magnified right anterior oblique and magnified lateral views of the head showed recanalization of the M3 branch. A dissection flap is noted in the cavernous segment. Delayed frontal and lateral angiograms of the head showed nonocclusive clot formation at the level of the dissection. Using biplane roadmap guidance, an SL 10 microcatheter was navigated over an Aristotle 14 micro guidewire into the  cavernous segment of the right ACA.  Then, a 4 x 24 mm neuroform atlas stent was navigated and deployed in the petrous segment, covering the area of dissection. Magnified frontal and lateral angiograms of the head showed adequate stent positioning covering the dissected segment. Frontal and lateral angiograms of the head showed no evidence of thromboembolic complication with complete opacification of the right MCA vascular tree with slow flow in distal cortical branches (TICI 2C). The guide catheter was retracted into the right common carotid artery. Magnified frontal and lateral angiograms of the neck were obtained, centered on the recently wall stent. The stent is widely open without residual stenosis or evidence of in stent clot formation. The catheter was subsequently withdrawn. Flat panel CT of the head was obtained and post processed in a separate workstation with concurrent attending physician supervision. Selected images were sent to PACS. Small hyperdensity is noted in the right sylvian fissure and in the right high convexity, may represent subarachnoid hemorrhage versus contrast extravasation. Right common femoral artery angiogram was obtained in right anterior oblique view. The puncture is at the level of the common femoral artery. The artery has mild atherosclerotic changes without significant stenosis, adequate for closure device. The sheath was exchanged over the wire for a Perclose prostyle which was utilized for access closure. Immediate hemostasis was achieved. IMPRESSION: 1. Successful mechanical thrombectomy for treatment of a right M1/MCA occlusion with complete recanalization (TICI 2C). 2. Right carotid bifurcation angioplasty and stenting without residual stenosis. 3. 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 PLAN: Transfer to ICU. Follow-up CT in 3 hours to evaluate for SAH progression. Electronically Signed   By: Pedro Earls M.D.   On: 04/13/2022 10:59   IR CT Head Ltd  Result Date: 04/13/2022 INDICATION: 76 year old male with past medical history significant for emphysema, hypertension, thyroid disease and prior traumatic intracranial hemorrhage; baseline modified Rankin scale 0. He presented to emergency with acute onset left-sided weakness; NIHSS 10. Head CT showed ongoing ischemia in the right insula (ASPECTS 10). 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 Patient received intravenous TNK and was transferred to our service for mechanical thrombectomy. EXAM: ULTRASOUND-GUIDED VASCULAR ACCESS DIAGNOSTIC CEREBRAL ANGIOGRAM MECHANICAL THROMBECTOMY CERVICAL RIGHT CAROTID STENTING WITH CEREBRAL PROTECTION DEVICE INTRACRANIAL STENTING FLAT PANEL HEAD CT (X2) COMPARISON:  CT/CT angiogram of the head and neck April 12, 2022. MEDICATIONS: No antibiotics administered. ANESTHESIA/SEDATION: The procedure was performed under general anesthesia. CONTRAST:  Are and 25 mL of Omnipaque 300 mg/mL FLUOROSCOPY: Radiation Exposure Index (as provided by the fluoroscopic device): 3,568 mGy Kerma COMPLICATIONS: None immediate. TECHNIQUE: Informed written consent was obtained from the patient's wife after a thorough discussion of the procedural risks, benefits and alternatives. All questions were addressed. Maximal Sterile Barrier Technique was utilized including caps, mask, sterile gowns, sterile gloves, sterile drape, hand hygiene and skin antiseptic. A timeout was performed prior to the initiation of the procedure. The right groin was prepped and draped in the usual sterile fashion. Using a micropuncture kit and the modified Seldinger technique, access was gained to the right common femoral artery and an 8 French sheath was placed. Real-time ultrasound guidance was utilized for vascular access including the acquisition of a permanent ultrasound image  documenting patency of the accessed vessel. Under fluoroscopy, a Zoom 88 support guide catheter was navigated over a 6 [place] Berenstein 2 catheter and a 0.035" Terumo Glidewire into the aortic  arch. The catheter was placed into the right common carotid artery. Frontal and lateral angiograms of the neck were obtained. The Berenstein 2 catheter was removed. Using biplane roadmap guidance, attempted navigation of a zoom 71 aspiration catheter over a phenom 21 microcatheter and a Aristotle 14 microguidewire into the upper cervical segment of the right ICA proved unsuccessful. The microcatheter and aspiration catheter were removed and the Berenstein 2 catheter was again navigated through the zoom 88 guide catheter and over a 0.035" Terumo Glidewire into the right common carotid artery. The Berenstein 2 catheter was then advanced over the wire into the distal cervical segment of the right ICA. The guide catheter was then advanced into the distal cervical segment of the right ICA. The diagnostic catheter was removed. Frontal and lateral angiograms of the head were obtained. FINDINGS: 1. Normal caliber of the right common femoral artery, adequate for vascular access. 2. 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 3. Atherosclerotic changes along the petrous and cavernous segment of the right internal carotid artery without hemodynamically significant stenosis. 4. 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 PROCEDURE: Using biplane roadmap guidance, a zoom 71 aspiration catheter was navigated over a phenom 21 microcatheter and a Aristotle 14 microguidewire into the cavernous segment of the right ICA. The microcatheter was then navigated over the wire into the right M2/MCA posterior division branch. Then, a 4 x 40 mm solitaire stent retriever was deployed spanning the M1 and M2 segment. The  device was allowed to intercalated with the clot for 4 minutes. The microcatheter was removed. The aspiration catheter was advanced to the level of occlusion and connected to an aspiration pump. The thrombectomy device and aspiration catheter were removed under constant aspiration. Right internal carotid artery angiograms with frontal, lateral, magnified right anterior oblique and magnified lateral views of the head were obtained showing complete recanalization of the right MCA vascular tree with slow flow in distal cortical branches (TICI 2C). The guide catheter was retracted into the right common carotid artery. Frontal and lateral angiograms of the neck were obtained. 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 Flat panel CT of the head was obtained and post processed in a separate workstation with concurrent attending physician supervision. Selected images were sent to PACS. No evidence of hemorrhagic complication. At this point, patient was loaded on Cangrelor followed by continuous drip. Right common carotid artery angiograms with frontal and lateral views of the head were obtained and used as biplane roadmap. Then, a 2.5-4.8 mm Emboshield NAV 6 cerebral protection device was navigated and deployed into the distal cervical segment of the right ICA. Then, a 4 x 30 mm Viatrac balloon was navigated into the right carotid bulb. Angioplasty was performed under fluoroscopy. The balloon was then removed. Then, a 8-6 x 40 mm XACT carotid stent was deployed spanning the distal right common carotid artery and proximal right internal carotid artery, across the stenosis. In stent angioplasty was then performed with a 6 x 20 mm Viatrac balloon. The cerebral protection device was then recaptured. Right internal carotid artery angiogram with frontal and lateral views of the neck showed adequate stent positioning with no residual stenosis  or in stent clot formation. A nonocclusive filling defect is noted in the petrous segment of the right ICA. Right internal carotid artery angiogram with frontal and  lateral views of the head showed occlusion of the right M3/MCA superior division branch. Using biplane roadmap guidance, a zoom 71 aspiration catheter was navigated over a phenom 21 microcatheter and a Aristotle 14 microguidewire into the cavernous segment of the right ICA. The microcatheter was then navigated over the wire into the right M3 segment. Then, a 3 x 20 mm solitaire stent retriever was deployed spanning the M3 segment. The device was allowed to intercalated with the clot for 4 minutes. The microcatheter was removed. The aspiration catheter was advanced to the M1 segment and connected to an aspiration pump. The thrombectomy device and aspiration catheter were removed under constant aspiration. Right internal carotid artery angiograms with frontal, lateral, magnified right anterior oblique and magnified lateral views of the head showed recanalization of the M3 branch. A dissection flap is noted in the cavernous segment. Delayed frontal and lateral angiograms of the head showed nonocclusive clot formation at the level of the dissection. Using biplane roadmap guidance, an SL 10 microcatheter was navigated over an Aristotle 14 micro guidewire into the cavernous segment of the right ACA. Then, a 4 x 24 mm neuroform atlas stent was navigated and deployed in the petrous segment, covering the area of dissection. Magnified frontal and lateral angiograms of the head showed adequate stent positioning covering the dissected segment. Frontal and lateral angiograms of the head showed no evidence of thromboembolic complication with complete opacification of the right MCA vascular tree with slow flow in distal cortical branches (TICI 2C). The guide catheter was retracted into the right common carotid artery. Magnified frontal and lateral angiograms of the  neck were obtained, centered on the recently wall stent. The stent is widely open without residual stenosis or evidence of in stent clot formation. The catheter was subsequently withdrawn. Flat panel CT of the head was obtained and post processed in a separate workstation with concurrent attending physician supervision. Selected images were sent to PACS. Small hyperdensity is noted in the right sylvian fissure and in the right high convexity, may represent subarachnoid hemorrhage versus contrast extravasation. Right common femoral artery angiogram was obtained in right anterior oblique view. The puncture is at the level of the common femoral artery. The artery has mild atherosclerotic changes without significant stenosis, adequate for closure device. The sheath was exchanged over the wire for a Perclose prostyle which was utilized for access closure. Immediate hemostasis was achieved. IMPRESSION: 1. Successful mechanical thrombectomy for treatment of a right M1/MCA occlusion with complete recanalization (TICI 2C). 2. Right carotid bifurcation angioplasty and stenting without residual stenosis. 3. 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 PLAN: Transfer to ICU. Follow-up CT in 3 hours to evaluate for SAH progression. Electronically Signed   By: Pedro Earls M.D.   On: 04/13/2022 10:59   MR BRAIN WO CONTRAST  Result Date: 04/13/2022 CLINICAL DATA:  Stroke follow-up EXAM: MRI HEAD WITHOUT CONTRAST TECHNIQUE: Multiplanar, multiecho pulse sequences of the brain and surrounding structures were obtained without intravenous contrast. COMPARISON:  Head CT from yesterday FINDINGS: Brain: Patchy restricted diffusion along the right cerebral cortex, greatest at the anterior insula and frontal operculum. Punctate areas of restricted diffusion in the bilateral medial temporal lobe close to the hippocampi, PCA distribution. Mild restricted diffusion  in the right caudate nucleus best seen on FLAIR imaging there is hyperintensity within sulci of the right cerebral convexity, correlating with high-density areas by prior CT. Vascular: Major flow voids are preserved Skull and  upper cervical spine: Normal marrow signal Sinuses/Orbits: Negative IMPRESSION: 1. 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 Small infarcts also seen in the bilateral medial temporal lobes. 2. Subarachnoid hemorrhage along the right cerebral convexity similar to prior head CT. Electronically Signed   By: Jorje Guild M.D.   On: 04/13/2022 05:48   CT HEAD WO CONTRAST (5MM)  Result Date: 04/12/2022 CLINICAL DATA:  Status post mechanical thrombectomy, follow-up subarachnoid hemorrhage EXAM: CT HEAD WITHOUT CONTRAST TECHNIQUE: Contiguous axial images were obtained from the base of the skull through the vertex without intravenous contrast. RADIATION DOSE REDUCTION: This exam was performed according to the departmental dose-optimization program which includes automated exposure control, adjustment of the mA and/or kV according to patient size and/or use of iterative reconstruction technique. COMPARISON:  04/12/2022 CT head 11:21 a.m. and 04/12/2022 CT head 8:31 a.m. FINDINGS: Evaluation is somewhat limited by motion artifact. Brain: Subarachnoid hyperdense material in the right frontal, temporal, and frontoparietal regions (series 7, images 7, 13, and 20, respectively), which appears similar in size and distribution to the CT done immediately post thrombectomy. Additional hyperdense material in the right inferior frontal lobe is also favored to be subarachnoid and appears less conspicuous than on the post thrombectomy CT. Previously noted loss of gray-white differentiation in the right insula is less conspicuous, possibly due to the hyperdense material. No additional acute infarct, mass, mass effect, or midline shift. No  hydrocephalus. Vascular: Residual contrast is noted in the blood vessels. No definite hyperdense vessel. Atherosclerotic calcifications in the intracranial carotid and vertebral arteries. Skull: Negative for fracture or focal lesion. Sinuses/Orbits: No acute finding. Other: The mastoids are well aerated. IMPRESSION: 1. Subarachnoid hyperdense material in the right cerebral hemisphere is favored to represent a small amount of subarachnoid hemorrhage, which appears similar to the immediate post thrombectomy CT. This could also represent contrast staining. 2. No additional acute intracranial process. Electronically Signed   By: Merilyn Baba M.D.   On: 04/12/2022 14:36   ECHOCARDIOGRAM COMPLETE  Result Date: 04/12/2022    ECHOCARDIOGRAM REPORT   Patient Name:   HAZEM KENNER Date of Exam: 04/12/2022 Medical Rec #:  [id number]    Height:       67.5 in Accession #:    [id number]   Weight:       160.1 lb Date of Birth:  [birth year]   BSA:          1.850 m Patient Age:    72 years     BP:           105/90 mmHg Patient Gender: M            HR:           76 bpm. Exam Location:  Inpatient Procedure: 2D Echo Indications:    stroke  History:        Patient has no prior history of Echocardiogram examinations.                 Stroke; Risk Factors:Hypertension, Former Smoker and emphysema.  Sonographer:    Harvie Junior Referring Phys: [phone] ERIC LINDZEN  Sonographer Comments: Technically difficult study due to poor echo windows, suboptimal parasternal window, suboptimal apical window and suboptimal subcostal window. Image acquisition challenging due to uncooperative patient, Image acquisition challenging  due to respiratory motion and supine. IMPRESSIONS  1. Left ventricular ejection fraction, by estimation, is 60 to 65%. The left ventricle has normal function. The left ventricle  has no regional wall motion abnormalities. Left ventricular diastolic parameters are consistent with Grade I diastolic dysfunction (impaired relaxation).   2. Right ventricular systolic function is normal. The right ventricular size is normal. Tricuspid regurgitation signal is inadequate for assessing PA pressure.  3. The mitral valve is normal in structure. No evidence of mitral valve regurgitation. No evidence of mitral stenosis.  4. The aortic valve is normal in structure. Aortic valve regurgitation is not visualized. No aortic stenosis is present.  5. The inferior vena cava is normal in size with greater than 50% respiratory variability, suggesting right atrial pressure of 3 mmHg. Conclusion(s)/Recommendation(s): No intracardiac source of embolism detected on this transthoracic study. Consider a transesophageal echocardiogram to exclude cardiac source of embolism if clinically indicated. FINDINGS  Left Ventricle: Left ventricular ejection fraction, by estimation, is 60 to 65%. The left ventricle has normal function. The left ventricle has no regional wall motion abnormalities. The left ventricular internal cavity size was normal in size. There is  no left ventricular hypertrophy. Left ventricular diastolic parameters are consistent with Grade I diastolic dysfunction (impaired relaxation). Normal left ventricular filling pressure. Right Ventricle: The right ventricular size is normal. No increase in right ventricular wall thickness. Right ventricular systolic function is normal. Tricuspid regurgitation signal is inadequate for assessing PA pressure. Left Atrium: Left atrial size was normal in size. Right Atrium: Right atrial size was normal in size. Pericardium: There is no evidence of pericardial effusion. Mitral Valve: The mitral valve is normal in structure. No evidence of mitral valve regurgitation. No evidence of mitral valve stenosis. Tricuspid Valve: The tricuspid valve is normal in structure. Tricuspid valve regurgitation is not demonstrated. No evidence of tricuspid stenosis. Aortic Valve: The aortic valve is normal in structure. Aortic valve regurgitation  is not visualized. No aortic stenosis is present. Aortic valve mean gradient measures 3.0 mmHg. Aortic valve peak gradient measures 4.2 mmHg. Aortic valve area, by VTI measures 2.43 cm. Pulmonic Valve: The pulmonic valve was normal in structure. Pulmonic valve regurgitation is not visualized. No evidence of pulmonic stenosis. Aorta: The aortic root is normal in size and structure. Venous: The inferior vena cava is normal in size with greater than 50% respiratory variability, suggesting right atrial pressure of 3 mmHg. IAS/Shunts: The interatrial septum appears to be lipomatous. No atrial level shunt detected by color flow Doppler.  LEFT VENTRICLE PLAX 2D LVIDd:         3.10 cm     Diastology LVIDs:         2.30 cm     LV e' medial:    5.55 cm/s LV PW:         0.90 cm     LV E/e' medial:  8.6 LV IVS:        1.00 cm     LV e' lateral:   7.51 cm/s LVOT diam:     1.80 cm     LV E/e' lateral: 6.3 LV SV:         52 LV SV Index:   28 LVOT Area:     2.54 cm  LV Volumes (MOD) LV vol d, MOD A2C: 44.9 ml LV vol d, MOD A4C: 59.7 ml LV vol s, MOD A2C: 18.3 ml LV vol s, MOD A4C: 21.2 ml LV SV MOD A2C:     26.6 ml LV SV MOD A4C:     59.7 ml LV SV MOD BP:      31.5 ml RIGHT VENTRICLE RV S prime:  15.40 cm/s TAPSE (M-mode): 2.1 cm LEFT ATRIUM           Index LA Vol (A4C): 21.6 ml 11.68 ml/m  AORTIC VALVE AV Area (Vmax):    2.74 cm AV Area (Vmean):   2.25 cm AV Area (VTI):     2.43 cm AV Vmax:           103.00 cm/s AV Vmean:          81.300 cm/s AV VTI:            0.214 m AV Peak Grad:      4.2 mmHg AV Mean Grad:      3.0 mmHg LVOT Vmax:         111.00 cm/s LVOT Vmean:        71.800 cm/s LVOT VTI:          0.204 m LVOT/AV VTI ratio: 0.95  AORTA Ao Root diam: 3.10 cm MITRAL VALVE MV Area (PHT): 3.77 cm    SHUNTS MV Decel Time: 201 msec    Systemic VTI:  0.20 m MV E velocity: 47.60 cm/s  Systemic Diam: 1.80 cm MV A velocity: 96.40 cm/s MV E/A ratio:  0.49 Fransico Him MD Electronically signed by Fransico Him MD Signature  Date/Time: 04/12/2022/1:46:08 PM    Final    CT ANGIO HEAD NECK W WO CM (CODE STROKE)  Result Date: 04/12/2022 CLINICAL DATA:  Neuro deficit, acute, stroke suspected. EXAM: CT ANGIOGRAPHY HEAD AND NECK TECHNIQUE: Multidetector CT imaging of the head and neck was performed using the standard protocol during bolus administration of intravenous contrast. Multiplanar CT image reconstructions and MIPs were obtained to evaluate the vascular anatomy. Carotid stenosis measurements (when applicable) are obtained utilizing NASCET criteria, using the distal internal carotid diameter as the denominator. RADIATION DOSE REDUCTION: This exam was performed according to the departmental dose-optimization program which includes automated exposure control, adjustment of the mA and/or kV according to patient size and/or use of iterative reconstruction technique. CONTRAST:  8m OMNIPAQUE IOHEXOL 350 MG/ML SOLN COMPARISON:  Same day noncontrast head CT 04/12/2022. FINDINGS: CTA NECK FINDINGS Aortic arch: The origins of the innominate, left common carotid and left subclavian arteries are excluded from the field of view. Atherosclerotic plaque within the visualized aortic arch and proximal major branch vessels of the neck. Streak artifact from a dense right-sided contrast bolus partially obscures the right subclavian artery. No hemodynamically significant stenosis within the visible innominate or proximal subclavian arteries. Right carotid system: CCA patent. Mild atherosclerotic plaque within this vessel without hemodynamically significant stenosis. Atherosclerotic plaque at the carotid bifurcation and within the proximal ICA. The ICA becomes occluded shortly beyond its origin and remains occluded throughout the remainder of the neck. Left carotid system: The origin of the CCA is excluded from the field of view. The visualized CCA is patent with atherosclerotic plaque, but without hemodynamically significant stenosis. Atherosclerotic  plaque at the carotid bifurcation and within the proximal ICA. No hemodynamically significant stenosis within the proximal ICA. 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 Vertebral arteries: Vertebral arteries patent within the neck without stenosis. The left vertebral artery is dominant. Skeleton: Cervical spine CT reported separately. Other neck: No neck mass or cervical lymphadenopathy. Upper chest: Emphysema. No consolidation within the imaged lung apices. Review of the MIP images confirms the above findings CTA HEAD FINDINGS Anterior circulation: The right ICA remains occluded to the level of the distal cavernous segment. Subocclusive thrombus is present within the distal cavernous segment (for instance as seen  on series 7, image 111). There is more robust reconstitution of enhancement at the paraclinoid and supraclinoid level. There is abrupt occlusion of the right middle cerebral artery at the proximal M1 segment level. There is some enhancement of M2 and more distal right MCA vessels, although asymmetrically diminished as compared to fat on the left. The intracranial left ICA is patent. Scattered atherosclerotic plaque within this vessel without stenosis. The M1 left middle cerebral artery is patent. No left M2 proximal branch occlusion. The anterior cerebral arteries are patent. No intracranial aneurysm is identified. 1-2 mm posteriorly inferiorly projecting vascular protrusion arising from the paraclinoid right ICA, which may reflect an aneurysm (series 9, image 84). Posterior circulation: The intracranial vertebral arteries are patent. Calcified atherosclerotic plaque within the left V4 segment with no more than mild stenosis. The basilar artery is patent. The posterior cerebral arteries are patent. Posterior communicating arteries are present, bilaterally Venous sinuses: Within the limitations of contrast timing, no convincing thrombus. Anatomic variants: None significant. Review of the MIP images confirms the  above findings Critical/emergent results were called by telephone at the time of interpretation on 04/12/2022 at 8:40 am to provider [facility] , who verbally acknowledged these results. IMPRESSION: CTA neck: 1. The origins of the innominate artery, left common carotid artery and left subclavian artery are excluded from the field of view. 2. The right ICA becomes occluded just beyond its origin, and remains occluded throughout the remainder of the neck. Although the appearance is nonspecific, right ICA dissection is a consideration. 3. Within described limitations, there is no hemodynamically significant stenosis of the left common carotid or cervical internal carotid arteries. 4. Vertebral arteries patent within the neck without stenosis. 5. 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 6.  Aortic Atherosclerosis (ICD10-I70.0). CTA head: 1. The intracranial right ICA remains occluded to the distal cavernous segment. Subocclusive thrombus is present within the distal cavernous segment. There is more robust reconstitution of right ICA enhancement at the paraclinoid and supraclinoid level. However, there is abrupt occlusion of the right middle cerebral artery at the proximal M1 segment level. 2. 1-2 mm posteroinferiorly projecting vascular protrusion arising from the paraclinoid right ICA, which may reflect an aneurysm. Electronically Signed   By: Kellie Simmering D.O.   On: 04/12/2022 09:12   CT C-SPINE NO CHARGE  Result Date: 04/12/2022 CLINICAL DATA:  Unwitnessed fall. EXAM: CT Cervical Spine without contrast TECHNIQUE: Multiplanar CT images of the cervical spine were reconstructed from contemporary CT of the Neck. RADIATION DOSE REDUCTION: This exam was performed according to the departmental dose-optimization program which includes automated exposure control, adjustment of the mA and/or kV according to patient size and/or use of iterative reconstruction technique. CONTRAST:  None or No additional COMPARISON:  None  Available. FINDINGS: Alignment: Anatomic alignment.  No static listhesis. Skull base and vertebrae: No acute fracture. No aggressive lytic or sclerotic osseous lesion. Soft tissues and spinal canal: Intraspinal soft tissues are not fully imaged on this examination due to poor soft tissue contrast, but there is no gross soft tissue abnormality. No prevertebral fluid or swelling. No visible canal hematoma. Disc levels: Degenerative disease with disc height loss at C5-6, C6-7 and to lesser extent C7-T1 and C4-5. Upper chest: Lung apices are clear.  Centrilobular emphysema. Other: No fluid collection or hematoma. IMPRESSION: 1.  No acute osseous injury of the cervical spine. 2. Cervical spine spondylosis as described above. 3.  Emphysema (ICD10-J43.9). Electronically Signed   By: Kathreen Devoid M.D.   On:  04/12/2022 08:53   CT HEAD CODE STROKE WO CONTRAST  Result Date: 04/12/2022 CLINICAL DATA:  Code stroke. Neuro deficit, acute, stroke suspected. EXAM: CT HEAD WITHOUT CONTRAST TECHNIQUE: Contiguous axial images were obtained from the base of the skull through the vertex without intravenous contrast. RADIATION DOSE REDUCTION: This exam was performed according to the departmental dose-optimization program which includes automated exposure control, adjustment of the mA and/or kV according to patient size and/or use of iterative reconstruction technique. COMPARISON:  No pertinent prior exams available for comparison. FINDINGS: Brain: Mild generalized parenchymal atrophy. Loss of gray-white differentiation within the right insula compatible with an acute right MCA territory infarct (for instance as seen on series 2, images 15 and 16). There is no acute intracranial hemorrhage. No extra-axial fluid collection. No evidence of an intracranial mass. No midline shift. Vascular: Atherosclerotic calcifications. Asymmetric hyperdensity of the M1 right middle cerebral artery suspicious for the presence of endoluminal thrombus.  Skull: No fracture or aggressive osseous lesion. Sinuses/Orbits: No mass or acute finding within the imaged orbits. Mucosal thickening within the bilateral maxillary sinuses at the imaged levels (trace right, mild left). ASPECTS ([place] Stroke Program Early CT Score) - Ganglionic level infarction (caudate, lentiform nuclei, internal capsule, insula, M1-M3 cortex): 6 - Supraganglionic infarction (M4-M6 cortex): 3 Total score (0-10 with 10 being normal): 9 Critical/emergent results were called by telephone at the time of interpretation on 04/12/2022 at 8:38 am to provider Dr. Cheral Marker, who verbally acknowledged these results. 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 ASPECTS is 9. Asymmetric hyperdensity of the M1 right middle cerebral artery suspicious for the presence of endoluminal thrombus. Correlate with findings on pending CTA head/neck. Mild generalized parenchymal atrophy. Electronically Signed   By: Kellie Simmering D.O.   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  Post-procedure CT showed possible small SAH. MR brain this morning showed stable findings compared to prior imaging.   The patient denies any pain or discomfort. He is able to move all extremities and states he  feels at baseline with this strength and coordination.   Patient has received aspirin and brilinta today and is aware he will need to remain on dual antiplatelet therapy for a minimum of three months. Dr. Karenann Cai will follow up with him outpatient in 3 months. Patient will also need a carotid duplex study and MRA in 3 months. An order has been placed for our schedulers to call him with a date/time of these appointments.   Please call [facility] with any questions.  Electronically Signed: Soyla Dryer, AGACNP-BC [phone] 04/13/2022, 2:24 PM   I spent a total of 15 Minutes at the the patient's bedside AND on the patient's hospital floor or unit, greater than 50% of which was counseling/coordinating care for right MCA stroke

## 2022-04-13 NOTE — Progress Notes (Signed)
PT Cancellation Note  Patient Details Name: Alexander Stewart MRN: [id number] DOB: [birth year]   Cancelled Treatment:    Reason Eval/Treat Not Completed: Active bedrest order Will await increase in activity orders prior to PT evaluation. Will follow.   [name] 04/13/2022, 7:19 AM [name], PT, DPT Acute Rehabilitation Services Secure chat preferred Office [phone]

## 2022-04-14 ENCOUNTER — Other Ambulatory Visit: Payer: Self-pay

## 2022-04-14 ENCOUNTER — Other Ambulatory Visit: Payer: Self-pay | Admitting: Neurology

## 2022-04-14 ENCOUNTER — Other Ambulatory Visit: Payer: Self-pay | Admitting: Cardiology

## 2022-04-14 ENCOUNTER — Other Ambulatory Visit (HOSPITAL_COMMUNITY): Payer: Self-pay

## 2022-04-14 DIAGNOSIS — I6340 Cerebral infarction due to embolism of unspecified cerebral artery: Secondary | ICD-10-CM

## 2022-04-14 DIAGNOSIS — I10 Essential (primary) hypertension: Secondary | ICD-10-CM

## 2022-04-14 DIAGNOSIS — I639 Cerebral infarction, unspecified: Secondary | ICD-10-CM

## 2022-04-14 LAB — BASIC METABOLIC PANEL
Anion gap: 8 (ref 5–15)
BUN: 15 mg/dL (ref 8–23)
CO2: 26 mmol/L (ref 22–32)
Calcium: 8.4 mg/dL — ABNORMAL LOW (ref 8.9–10.3)
Chloride: 106 mmol/L (ref 98–111)
Creatinine, Ser: 1.1 mg/dL (ref 0.61–1.24)
GFR, Estimated: >60 mL/min (ref >60)
Glucose, Bld: 110 mg/dL — ABNORMAL HIGH (ref 70–99)
Potassium: 3.8 mmol/L (ref 3.5–5.1)
Sodium: 140 mmol/L (ref 135–145)

## 2022-04-14 LAB — CBC
HCT: 31.6 % — ABNORMAL LOW (ref 39.0–52.0)
Hemoglobin: 11.1 g/dL — ABNORMAL LOW (ref 13.0–17.0)
MCH: 34 pg (ref 26.0–34.0)
MCHC: 35.1 g/dL (ref 30.0–36.0)
MCV: 96.9 fL (ref 80.0–100.0)
Platelets: 150 10*3/uL (ref 150–400)
RBC: 3.26 MIL/uL — ABNORMAL LOW (ref 4.22–5.81)
RDW: 12.2 % (ref 11.5–15.5)
WBC: 9.7 10*3/uL (ref 4.0–10.5)
nRBC: 0 % (ref 0.0–0.2)

## 2022-04-14 MED ORDER — CLEVIDIPINE BUTYRATE 0.5 MG/ML IV EMUL
INTRAVENOUS | Status: AC
  Filled 2022-04-14: qty 100

## 2022-04-14 MED ORDER — TICAGRELOR 90 MG PO TABS: 90.0000 mg | ORAL_TABLET | Freq: Two times a day (BID) | ORAL | 2 refills | Status: AC

## 2022-04-14 MED ORDER — ROSUVASTATIN CALCIUM 20 MG PO TABS: 20.0000 mg | ORAL_TABLET | Freq: Every day | ORAL | 1 refills | Status: AC

## 2022-04-14 MED ORDER — ASPIRIN 81 MG PO TBEC: 81.0000 mg | DELAYED_RELEASE_TABLET | Freq: Every day | ORAL | 12 refills | Status: AC

## 2022-04-14 NOTE — Discharge Instructions (Addendum)
Alexander Stewart, you were admitted with left sided weakness due to a stroke.  You were given TNK to treat this and taken to interventional radiology where the clot causing the stroke was removed.  You received a stent in your right carotid artery and will need to stay or aspirin and Brillinta for three months because of this.

## 2022-04-14 NOTE — TOC Benefit Eligibility Note (Signed)
Patient Research scientist (life sciences) completed.     The patient is currently admitted and upon discharge could be taking Brilinta 90mg  tablet.   The current 30 day co-pay is, $47.00.   The patient is insured through [facility] Part D.

## 2022-04-14 NOTE — Progress Notes (Signed)
Physical Therapy Treatment Patient Details Name: Alexander Stewart MRN: [id number] DOB: 06-01-1946 Today's Date: 04/14/2022   History of Present Illness Alexander Stewart is an 76 y.o. male who presented from home with L sided weakness, left hemneglect, left facial droop and left visual field cut and found down on the floor. Post-TNK and VIR. 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    PT Comments    Patient demonstrating low fall risk per Dynamic Gait Assessment this session, scored 21/24.  Did notice difficulty using cell phone, though he reports not tech savvy.  Encouraged to have someone check behind him when doing finances at home to be sure back to baseline with higher level tasks.  Pt for home today.  Remains stable without PT follow up.    Recommendations for follow up therapy are one component of a multi-disciplinary discharge planning process, led by the attending physician.  Recommendations may be updated based on patient status, additional functional criteria and insurance authorization.  Follow Up Recommendations  No PT follow up     Assistance Recommended at Discharge Intermittent Supervision/Assistance  Patient can return home with the following Assist for transportation;Direct supervision/assist for medications management;Direct supervision/assist for financial management   Equipment Recommendations  None recommended by PT    Recommendations for Other Services       Precautions / Restrictions Precautions Precaution Comments: L inattention     Mobility  Bed Mobility Overal bed mobility: Modified Independent                  Transfers Overall transfer level: Modified independent   Transfers: Sit to/from Stand                  Ambulation/Gait Ambulation/Gait assistance: Supervision, Min guard Gait Distance (Feet): 300 Feet Assistive device: None Gait Pattern/deviations: Step-through pattern        General Gait Details: completed DGI   Stairs Stairs: Yes Stairs assistance: Supervision Stair Management: One rail Right, Alternating pattern, Forwards Number of Stairs: 10 General stair comments: using step through pattern for descent as well today   Wheelchair Mobility    Modified Rankin (Stroke Patients Only)       Balance     Sitting balance-Leahy Scale: Good       Standing balance-Leahy Scale: Good                   Standardized Balance Assessment Standardized Balance Assessment : Dynamic Gait Index   Dynamic Gait Index Level Surface: Normal Change in Gait Speed: Mild Impairment Gait with Horizontal Head Turns: Normal Gait with Vertical Head Turns: Normal Gait and Pivot Turn: Normal Step Over Obstacle: Mild Impairment Step Around Obstacles: Normal Steps: Mild Impairment Total Score: 21      Cognition Arousal/Alertness: Awake/alert Behavior During Therapy: WFL for tasks assessed/performed Overall Cognitive Status: Impaired/Different from baseline Area of Impairment: Problem solving, Safety/judgement, Attention                   Current Attention Level: Selective     Safety/Judgement: Decreased awareness of deficits     General Comments: reports not tech savvy, but had hard time figuring out how to turn off flashlight on his phone; encouraged having someone check behind him doing finances at home        Exercises      General Comments        Pertinent Vitals/Pain Pain Assessment Pain Assessment: No/denies  pain    Home Living                          Prior Function            PT Goals (current goals can now be found in the care plan section) Progress towards PT goals: Progressing toward goals    Frequency    Min 4X/week      PT Plan Current plan remains appropriate    Co-evaluation              AM-PAC PT "6 Clicks" Mobility   Outcome Measure  Help needed turning from your back to your  side while in a flat bed without using bedrails?: None Help needed moving from lying on your back to sitting on the side of a flat bed without using bedrails?: None Help needed moving to and from a bed to a chair (including a wheelchair)?: None Help needed standing up from a chair using your arms (e.g., wheelchair or bedside chair)?: None Help needed to walk in hospital room?: None Help needed climbing 3-5 steps with a railing? : A Little 6 Click Score: 23    End of Session Equipment Utilized During Treatment: Gait belt   Patient left: in chair;with call bell/phone within reach   PT Visit Diagnosis: Other symptoms and signs involving the nervous system (R29.898)     Time: 2671-2458 PT Time Calculation (min) (ACUTE ONLY): 19 min  Charges:  $Gait Training: 8-22 mins                     Sheran Lawless, PT Acute Rehabilitation Services Office:[phone] 04/14/2022    Elray Mcgregor 04/14/2022, 6:29 PM

## 2022-04-14 NOTE — Discharge Summary (Addendum)
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 Swallow whole. Start taking on: April 15, 2022   metoprolol succinate 25 MG 24 hr tablet Commonly known as: TOPROL-XL Take 25 mg by mouth daily.   multivitamin with minerals Tabs tablet Take 1 tablet by mouth daily.   rosuvastatin 20 MG tablet Commonly known as: CRESTOR Take 1 tablet (20 mg total) by mouth daily. Start taking on: April 15, 2022   ticagrelor 90 MG Tabs tablet Commonly known as: BRILINTA Take 1 tablet (90 mg total) by mouth 2 (two) times daily.   vitamin B-12 100 MCG tablet Commonly known as: CYANOCOBALAMIN Take 100 mcg by mouth daily.   VITAMIN D3 PO Take 1 capsule by mouth daily.   vitamin E 1000 UNIT capsule Take 1,000 Units by mouth daily.        LABORATORY STUDIES CBC    Component Value Date/Time   WBC 9.7 04/14/2022 0434   RBC 3.26 (L) 04/14/2022 0434   HGB 11.1 (L) 04/14/2022 0434   HCT 31.6 (L) 04/14/2022 0434   PLT 150 04/14/2022 0434   MCV 96.9 04/14/2022 0434   MCH 34.0 04/14/2022 0434   MCHC 35.1 04/14/2022 0434   RDW 12.2 04/14/2022  0434   LYMPHSABS 2.5 04/12/2022 0820   MONOABS 0.5 04/12/2022 0820   EOSABS 0.0 04/12/2022 0820   BASOSABS 0.1 04/12/2022 0820   CMP    Component Value Date/Time   NA 140 04/14/2022 0254   K 3.8 04/14/2022 0254   CL 106 04/14/2022 0254   CO2 26 04/14/2022 0254   GLUCOSE 110 (H) 04/14/2022 0254   BUN 15 04/14/2022 0254   CREATININE 1.10 04/14/2022 0254   CALCIUM 8.4 (L) 04/14/2022 0254   PROT 6.9 04/12/2022 0820   ALBUMIN 3.9 04/12/2022 0820   AST 29 04/12/2022 0820   ALT 23 04/12/2022 0820   ALKPHOS 69 04/12/2022 0820   BILITOT 0.8 04/12/2022 0820   GFRNONAA >60 04/14/2022 0254   GFRAA 59 (L) 10/27/2018 0844   COAGS Lab Results  Component Value Date   INR 1.0 04/12/2022   Lipid Panel    Component Value Date/Time   CHOL 130 04/13/2022 0201   TRIG 74 04/13/2022 0201   HDL 39 (L) 04/13/2022 0201   CHOLHDL 3.3 04/13/2022 0201   VLDL 15 04/13/2022 0201   LDLCALC 76 04/13/2022 0201   HgbA1C  Lab Results  Component Value Date   HGBA1C 5.6 04/12/2022   Urinalysis No results found for: "COLORURINE", "APPEARANCEUR", "LABSPEC", "PHURINE", "GLUCOSEU", "HGBUR", "BILIRUBINUR", "KETONESUR", "PROTEINUR", "UROBILINOGEN", "  NITRITE", "LEUKOCYTESUR" Urine Drug Screen No results found for: "LABOPIA", "COCAINSCRNUR", "LABBENZ", "AMPHETMU", "THCU", "LABBARB"  Alcohol Level    Component Value Date/Time   ETH <10 04/12/2022 0820     SIGNIFICANT DIAGNOSTIC STUDIES VAS [place] LOWER EXTREMITY VENOUS (DVT)  Result Date: 04/14/2022  Lower Venous DVT Study Patient Name:  Alexander Stewart  Date of Exam:   04/13/2022 Medical Rec #: [id number]     Accession #:    [id number] Date of Birth: 01-03-1946    Patient Gender: M Patient Age:   76 years Exam Location:  [facility] Procedure:      VAS [place] LOWER EXTREMITY VENOUS (DVT) Referring Phys: Cornelius Moras Jaquon Gingerich --------------------------------------------------------------------------------  Indications: Stroke.  Limitations: Poor ultrasound/tissue  interface. Comparison Study: No previous exams Performing Technologist: Jody Hill RVT, RDMS  Examination Guidelines: A complete evaluation includes B-mode imaging, spectral Doppler, color Doppler, and power Doppler as needed of all accessible portions of each vessel. Bilateral testing is considered an integral part of a complete examination. Limited examinations for reoccurring indications may be performed as noted. The reflux portion of the exam is performed with the patient in reverse Trendelenburg.  +---------+---------------+---------+-----------+----------+-------------------+ RIGHT    CompressibilityPhasicitySpontaneityPropertiesThrombus Aging      +---------+---------------+---------+-----------+----------+-------------------+ CFV      Full           Yes      Yes                                      +---------+---------------+---------+-----------+----------+-------------------+ SFJ      Full                                                             +---------+---------------+---------+-----------+----------+-------------------+ FV Prox  Full           Yes      Yes                                      +---------+---------------+---------+-----------+----------+-------------------+ FV Mid   Full           Yes      Yes                                      +---------+---------------+---------+-----------+----------+-------------------+ FV DistalFull           Yes      Yes                                      +---------+---------------+---------+-----------+----------+-------------------+ PFV      Full                                                             +---------+---------------+---------+-----------+----------+-------------------+ POP      Full  Yes      Yes                                      +---------+---------------+---------+-----------+----------+-------------------+ PTV                                                   Not  well visualized +---------+---------------+---------+-----------+----------+-------------------+ PERO                                                  Not well visualized +---------+---------------+---------+-----------+----------+-------------------+   +---------+---------------+---------+-----------+----------+--------------+ LEFT     CompressibilityPhasicitySpontaneityPropertiesThrombus Aging +---------+---------------+---------+-----------+----------+--------------+ CFV      Full           Yes      Yes                                 +---------+---------------+---------+-----------+----------+--------------+ SFJ      Full                                                        +---------+---------------+---------+-----------+----------+--------------+ FV Prox  Full           Yes      Yes                                 +---------+---------------+---------+-----------+----------+--------------+ FV Mid   Full           Yes      Yes                                 +---------+---------------+---------+-----------+----------+--------------+ FV DistalFull           Yes      Yes                                 +---------+---------------+---------+-----------+----------+--------------+ PFV      Full                                                        +---------+---------------+---------+-----------+----------+--------------+ POP      Full           Yes      Yes                                 +---------+---------------+---------+-----------+----------+--------------+ PTV      Full                                                        +---------+---------------+---------+-----------+----------+--------------+  PERO     Full                                                        +---------+---------------+---------+-----------+----------+--------------+     *See table(s) above for measurements and observations. Electronically signed by  Deitra Mayo MD on 04/14/2022 at 6:37:30 AM.    Final    IR PERCUTANEOUS ART THROMBECTOMY/INFUSION INTRACRANIAL INC DIAG ANGIO  Result Date: 04/13/2022 INDICATION: 76 year old male with past medical history significant for emphysema, hypertension, thyroid disease and prior traumatic intracranial hemorrhage; baseline modified Rankin scale 0. He presented to emergency with acute onset left-sided weakness; NIHSS 10. Head CT showed ongoing ischemia in the right insula (ASPECTS 10). 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 Patient received intravenous TNK and was transferred to our service for mechanical thrombectomy. EXAM: ULTRASOUND-GUIDED VASCULAR ACCESS DIAGNOSTIC CEREBRAL ANGIOGRAM MECHANICAL THROMBECTOMY CERVICAL RIGHT CAROTID STENTING WITH CEREBRAL PROTECTION DEVICE INTRACRANIAL STENTING FLAT PANEL HEAD CT (X2) COMPARISON:  CT/CT angiogram of the head and neck April 12, 2022. MEDICATIONS: No antibiotics administered. ANESTHESIA/SEDATION: The procedure was performed under general anesthesia. CONTRAST:  Are and 25 mL of Omnipaque 300 mg/mL FLUOROSCOPY: Radiation Exposure Index (as provided by the fluoroscopic device): 3,614 mGy Kerma COMPLICATIONS: None immediate. TECHNIQUE: Informed written consent was obtained from the patient's wife after a thorough discussion of the procedural risks, benefits and alternatives. All questions were addressed. Maximal Sterile Barrier Technique was utilized including caps, mask, sterile gowns, sterile gloves, sterile drape, hand hygiene and skin antiseptic. A timeout was performed prior to the initiation of the procedure. The right groin was prepped and draped in the usual sterile fashion. Using a micropuncture kit and the modified Seldinger technique, access was gained to the right common femoral artery and an 8 French sheath was placed. Real-time ultrasound guidance was utilized for  vascular access including the acquisition of a permanent ultrasound image documenting patency of the accessed vessel. Under fluoroscopy, a Zoom 88 support guide catheter was navigated over a 6 [place] Berenstein 2 catheter and a 0.035" Terumo Glidewire into the aortic arch. The catheter was placed into the right common carotid artery. Frontal and lateral angiograms of the neck were obtained. The Berenstein 2 catheter was removed. Using biplane roadmap guidance, attempted navigation of a zoom 71 aspiration catheter over a phenom 21 microcatheter and a Aristotle 14 microguidewire into the upper cervical segment of the right ICA proved unsuccessful. The microcatheter and aspiration catheter were removed and the Berenstein 2 catheter was again navigated through the zoom 88 guide catheter and over a 0.035" Terumo Glidewire into the right common carotid artery. The Berenstein 2 catheter was then advanced over the wire into the distal cervical segment of the right ICA. The guide catheter was then advanced into the distal cervical segment of the right ICA. The diagnostic catheter was removed. Frontal and lateral angiograms of the head were obtained. FINDINGS: 1. Normal caliber of the right common femoral artery, adequate for vascular access. 2. 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 3. Atherosclerotic changes along the petrous and cavernous segment of the right internal carotid artery without hemodynamically significant stenosis. 4. Occlusion of the mid right M1/MCA just  distal to the origin of the anterior temporal branch. PROCEDURE: Using biplane roadmap guidance, a zoom 71 aspiration catheter was navigated over a phenom 21 microcatheter and a Aristotle 14 microguidewire into the cavernous segment of the right ICA. The microcatheter was then navigated over the wire into the right M2/MCA posterior division branch. Then, a 4 x 40 mm  solitaire stent retriever was deployed spanning the M1 and M2 segment. The device was allowed to intercalated with the clot for 4 minutes. The microcatheter was removed. The aspiration catheter was advanced to the level of occlusion and connected to an aspiration pump. The thrombectomy device and aspiration catheter were removed under constant aspiration. Right internal carotid artery angiograms with frontal, lateral, magnified right anterior oblique and magnified lateral views of the head were obtained showing complete recanalization of the right MCA vascular tree with slow flow in distal cortical branches (TICI 2C). The guide catheter was retracted into the right common carotid artery. Frontal and lateral angiograms of the neck were obtained. 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 Flat panel CT of the head was obtained and post processed in a separate workstation with concurrent attending physician supervision. Selected images were sent to PACS. No evidence of hemorrhagic complication. At this point, patient was loaded on Cangrelor followed by continuous drip. Right common carotid artery angiograms with frontal and lateral views of the head were obtained and used as biplane roadmap. Then, a 2.5-4.8 mm Emboshield NAV 6 cerebral protection device was navigated and deployed into the distal cervical segment of the right ICA. Then, a 4 x 30 mm Viatrac balloon was navigated into the right carotid bulb. Angioplasty was performed under fluoroscopy. The balloon was then removed. Then, a 8-6 x 40 mm XACT carotid stent was deployed spanning the distal right common carotid artery and proximal right internal carotid artery, across the stenosis. In stent angioplasty was then performed with a 6 x 20 mm Viatrac balloon. The cerebral protection device was then recaptured. Right internal carotid artery angiogram with frontal and lateral  views of the neck showed adequate stent positioning with no residual stenosis or in stent clot formation. A nonocclusive filling defect is noted in the petrous segment of the right ICA. Right internal carotid artery angiogram with frontal and lateral views of the head showed occlusion of the right M3/MCA superior division branch. Using biplane roadmap guidance, a zoom 71 aspiration catheter was navigated over a phenom 21 microcatheter and a Aristotle 14 microguidewire into the cavernous segment of the right ICA. The microcatheter was then navigated over the wire into the right M3 segment. Then, a 3 x 20 mm solitaire stent retriever was deployed spanning the M3 segment. The device was allowed to intercalated with the clot for 4 minutes. The microcatheter was removed. The aspiration catheter was advanced to the M1 segment and connected to an aspiration pump. The thrombectomy device and aspiration catheter were removed under constant aspiration. Right internal carotid artery angiograms with frontal, lateral, magnified right anterior oblique and magnified lateral views of the head showed recanalization of the M3 branch. A dissection flap is noted in the cavernous segment. Delayed frontal and lateral angiograms of the head showed nonocclusive clot formation at the level of the dissection. Using biplane roadmap guidance, an SL 10 microcatheter was navigated over an Aristotle 14 micro guidewire into the cavernous segment of the right ACA. Then, a 4 x 24 mm neuroform atlas stent was navigated and  deployed in the petrous segment, covering the area of dissection. Magnified frontal and lateral angiograms of the head showed adequate stent positioning covering the dissected segment. Frontal and lateral angiograms of the head showed no evidence of thromboembolic complication with complete opacification of the right MCA vascular tree with slow flow in distal cortical branches (TICI 2C). The guide catheter was retracted into the  right common carotid artery. Magnified frontal and lateral angiograms of the neck were obtained, centered on the recently wall stent. The stent is widely open without residual stenosis or evidence of in stent clot formation. The catheter was subsequently withdrawn. Flat panel CT of the head was obtained and post processed in a separate workstation with concurrent attending physician supervision. Selected images were sent to PACS. Small hyperdensity is noted in the right sylvian fissure and in the right high convexity, may represent subarachnoid hemorrhage versus contrast extravasation. Right common femoral artery angiogram was obtained in right anterior oblique view. The puncture is at the level of the common femoral artery. The artery has mild atherosclerotic changes without significant stenosis, adequate for closure device. The sheath was exchanged over the wire for a Perclose prostyle which was utilized for access closure. Immediate hemostasis was achieved. IMPRESSION: 1. Successful mechanical thrombectomy for treatment of a right M1/MCA occlusion with complete recanalization (TICI 2C). 2. Right carotid bifurcation angioplasty and stenting without residual stenosis. 3. 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 PLAN: Transfer to ICU. Follow-up CT in 3 hours to evaluate for SAH progression. Electronically Signed   By: Pedro Earls M.D.   On: 04/13/2022 10:59   IR US Guide Vasc Access Right  Result Date: 04/13/2022 INDICATION: 76 year old male with past medical history significant for emphysema, hypertension, thyroid disease and prior traumatic intracranial hemorrhage; baseline modified Rankin scale 0. He presented to emergency with acute onset left-sided weakness; NIHSS 10. Head CT showed ongoing ischemia in the right insula (ASPECTS 10). CT angiogram of the head and neck showed a cervical right ICA occlusion at the bulb with tandem  occlusion of the M1/MCA. Patient received intravenous TNK and was transferred to our service for mechanical thrombectomy. EXAM: ULTRASOUND-GUIDED VASCULAR ACCESS DIAGNOSTIC CEREBRAL ANGIOGRAM MECHANICAL THROMBECTOMY CERVICAL RIGHT CAROTID STENTING WITH CEREBRAL PROTECTION DEVICE INTRACRANIAL STENTING FLAT PANEL HEAD CT (X2) COMPARISON:  CT/CT angiogram of the head and neck April 12, 2022. MEDICATIONS: No antibiotics administered. ANESTHESIA/SEDATION: The procedure was performed under general anesthesia. CONTRAST:  Are and 25 mL of Omnipaque 300 mg/mL FLUOROSCOPY: Radiation Exposure Index (as provided by the fluoroscopic device): 1,448 mGy Kerma COMPLICATIONS: None immediate. TECHNIQUE: Informed written consent was obtained from the patient's wife after a thorough discussion of the procedural risks, benefits and alternatives. All questions were addressed. Maximal Sterile Barrier Technique was utilized including caps, mask, sterile gowns, sterile gloves, sterile drape, hand hygiene and skin antiseptic. A timeout was performed prior to the initiation of the procedure. The right groin was prepped and draped in the usual sterile fashion. Using a micropuncture kit and the modified Seldinger technique, access was gained to the right common femoral artery and an 8 French sheath was placed. Real-time ultrasound guidance was utilized for vascular access including the acquisition of a permanent ultrasound image documenting patency of the accessed vessel. Under fluoroscopy, a Zoom 88 support guide catheter was navigated over a 6 [place] Berenstein 2 catheter and a 0.035" Terumo Glidewire into the aortic arch. The catheter was placed into the right common carotid  artery. Frontal and lateral angiograms of the neck were obtained. The Berenstein 2 catheter was removed. Using biplane roadmap guidance, attempted navigation of a zoom 71 aspiration catheter over a phenom 21 microcatheter and a Aristotle 14 microguidewire into the  upper cervical segment of the right ICA proved unsuccessful. The microcatheter and aspiration catheter were removed and the Berenstein 2 catheter was again navigated through the zoom 88 guide catheter and over a 0.035" Terumo Glidewire into the right common carotid artery. The Berenstein 2 catheter was then advanced over the wire into the distal cervical segment of the right ICA. The guide catheter was then advanced into the distal cervical segment of the right ICA. The diagnostic catheter was removed. Frontal and lateral angiograms of the head were obtained. FINDINGS: 1. Normal caliber of the right common femoral artery, adequate for vascular access. 2. 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 3. Atherosclerotic changes along the petrous and cavernous segment of the right internal carotid artery without hemodynamically significant stenosis. 4. 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 PROCEDURE: Using biplane roadmap guidance, a zoom 71 aspiration catheter was navigated over a phenom 21 microcatheter and a Aristotle 14 microguidewire into the cavernous segment of the right ICA. The microcatheter was then navigated over the wire into the right M2/MCA posterior division branch. Then, a 4 x 40 mm solitaire stent retriever was deployed spanning the M1 and M2 segment. The device was allowed to intercalated with the clot for 4 minutes. The microcatheter was removed. The aspiration catheter was advanced to the level of occlusion and connected to an aspiration pump. The thrombectomy device and aspiration catheter were removed under constant aspiration. Right internal carotid artery angiograms with frontal, lateral, magnified right anterior oblique and magnified lateral views of the head were obtained showing complete recanalization of the right MCA vascular tree with slow flow in  distal cortical branches (TICI 2C). The guide catheter was retracted into the right common carotid artery. Frontal and lateral angiograms of the neck were obtained. 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 Flat panel CT of the head was obtained and post processed in a separate workstation with concurrent attending physician supervision. Selected images were sent to PACS. No evidence of hemorrhagic complication. At this point, patient was loaded on Cangrelor followed by continuous drip. Right common carotid artery angiograms with frontal and lateral views of the head were obtained and used as biplane roadmap. Then, a 2.5-4.8 mm Emboshield NAV 6 cerebral protection device was navigated and deployed into the distal cervical segment of the right ICA. Then, a 4 x 30 mm Viatrac balloon was navigated into the right carotid bulb. Angioplasty was performed under fluoroscopy. The balloon was then removed. Then, a 8-6 x 40 mm XACT carotid stent was deployed spanning the distal right common carotid artery and proximal right internal carotid artery, across the stenosis. In stent angioplasty was then performed with a 6 x 20 mm Viatrac balloon. The cerebral protection device was then recaptured. Right internal carotid artery angiogram with frontal and lateral views of the neck showed adequate stent positioning with no residual stenosis or in stent clot formation. A nonocclusive filling defect is noted in the petrous segment of the right ICA. Right internal carotid artery angiogram with frontal and lateral views of the head showed occlusion of the right  M3/MCA superior division branch. Using biplane roadmap guidance, a zoom 71 aspiration catheter was navigated over a phenom 21 microcatheter and a Aristotle 14 microguidewire into the cavernous segment of the right ICA. The microcatheter was then navigated over the wire into the right M3  segment. Then, a 3 x 20 mm solitaire stent retriever was deployed spanning the M3 segment. The device was allowed to intercalated with the clot for 4 minutes. The microcatheter was removed. The aspiration catheter was advanced to the M1 segment and connected to an aspiration pump. The thrombectomy device and aspiration catheter were removed under constant aspiration. Right internal carotid artery angiograms with frontal, lateral, magnified right anterior oblique and magnified lateral views of the head showed recanalization of the M3 branch. A dissection flap is noted in the cavernous segment. Delayed frontal and lateral angiograms of the head showed nonocclusive clot formation at the level of the dissection. Using biplane roadmap guidance, an SL 10 microcatheter was navigated over an Aristotle 14 micro guidewire into the cavernous segment of the right ACA. Then, a 4 x 24 mm neuroform atlas stent was navigated and deployed in the petrous segment, covering the area of dissection. Magnified frontal and lateral angiograms of the head showed adequate stent positioning covering the dissected segment. Frontal and lateral angiograms of the head showed no evidence of thromboembolic complication with complete opacification of the right MCA vascular tree with slow flow in distal cortical branches (TICI 2C). The guide catheter was retracted into the right common carotid artery. Magnified frontal and lateral angiograms of the neck were obtained, centered on the recently wall stent. The stent is widely open without residual stenosis or evidence of in stent clot formation. The catheter was subsequently withdrawn. Flat panel CT of the head was obtained and post processed in a separate workstation with concurrent attending physician supervision. Selected images were sent to PACS. Small hyperdensity is noted in the right sylvian fissure and in the right high convexity, may represent subarachnoid hemorrhage versus contrast  extravasation. Right common femoral artery angiogram was obtained in right anterior oblique view. The puncture is at the level of the common femoral artery. The artery has mild atherosclerotic changes without significant stenosis, adequate for closure device. The sheath was exchanged over the wire for a Perclose prostyle which was utilized for access closure. Immediate hemostasis was achieved. IMPRESSION: 1. Successful mechanical thrombectomy for treatment of a right M1/MCA occlusion with complete recanalization (TICI 2C). 2. Right carotid bifurcation angioplasty and stenting without residual stenosis. 3. 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 PLAN: Transfer to ICU. Follow-up CT in 3 hours to evaluate for SAH progression. Electronically Signed   By: Pedro Earls M.D.   On: 04/13/2022 10:59   IR CT Head Ltd  Result Date: 04/13/2022 INDICATION: 76 year old male with past medical history significant for emphysema, hypertension, thyroid disease and prior traumatic intracranial hemorrhage; baseline modified Rankin scale 0. He presented to emergency with acute onset left-sided weakness; NIHSS 10. Head CT showed ongoing ischemia in the right insula (ASPECTS 10). 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 Patient received intravenous TNK and was transferred to our service for mechanical thrombectomy. EXAM: ULTRASOUND-GUIDED VASCULAR ACCESS DIAGNOSTIC CEREBRAL ANGIOGRAM MECHANICAL THROMBECTOMY CERVICAL RIGHT CAROTID STENTING WITH CEREBRAL PROTECTION DEVICE INTRACRANIAL STENTING FLAT PANEL HEAD CT (X2) COMPARISON:  CT/CT angiogram of the head and neck April 12, 2022. MEDICATIONS: No antibiotics administered. ANESTHESIA/SEDATION: The procedure was performed under general anesthesia. CONTRAST:  Are and 25 mL of Omnipaque 300 mg/mL FLUOROSCOPY: Radiation Exposure  Index (as provided by the fluoroscopic device): 0,370 mGy Kerma COMPLICATIONS: None immediate. TECHNIQUE: Informed written consent was obtained from the patient's wife after a thorough discussion of the procedural risks, benefits and alternatives. All questions were addressed. Maximal Sterile Barrier Technique was utilized including caps, mask, sterile gowns, sterile gloves, sterile drape, hand hygiene and skin antiseptic. A timeout was performed prior to the initiation of the procedure. The right groin was prepped and draped in the usual sterile fashion. Using a micropuncture kit and the modified Seldinger technique, access was gained to the right common femoral artery and an 8 French sheath was placed. Real-time ultrasound guidance was utilized for vascular access including the acquisition of a permanent ultrasound image documenting patency of the accessed vessel. Under fluoroscopy, a Zoom 88 support guide catheter was navigated over a 6 [place] Berenstein 2 catheter and a 0.035" Terumo Glidewire into the aortic arch. The catheter was placed into the right common carotid artery. Frontal and lateral angiograms of the neck were obtained. The Berenstein 2 catheter was removed. Using biplane roadmap guidance, attempted navigation of a zoom 71 aspiration catheter over a phenom 21 microcatheter and a Aristotle 14 microguidewire into the upper cervical segment of the right ICA proved unsuccessful. The microcatheter and aspiration catheter were removed and the Berenstein 2 catheter was again navigated through the zoom 88 guide catheter and over a 0.035" Terumo Glidewire into the right common carotid artery. The Berenstein 2 catheter was then advanced over the wire into the distal cervical segment of the right ICA. The guide catheter was then advanced into the distal cervical segment of the right ICA. The diagnostic catheter was removed. Frontal and lateral angiograms of the head were obtained. FINDINGS: 1. Normal caliber  of the right common femoral artery, adequate for vascular access. 2. 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 3. Atherosclerotic changes along the petrous and cavernous segment of the right internal carotid artery without hemodynamically significant stenosis. 4. 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 PROCEDURE: Using biplane roadmap guidance, a zoom 71 aspiration catheter was navigated over a phenom 21 microcatheter and a Aristotle 14 microguidewire into the cavernous segment of the right ICA. The microcatheter was then navigated over the wire into the right M2/MCA posterior division branch. Then, a 4 x 40 mm solitaire stent retriever was deployed spanning the M1 and M2 segment. The device was allowed to intercalated with the clot for 4 minutes. The microcatheter was removed. The aspiration catheter was advanced to the level of occlusion and connected to an aspiration pump. The thrombectomy device and aspiration catheter were removed under constant aspiration. Right internal carotid artery angiograms with frontal, lateral, magnified right anterior oblique and magnified lateral views of the head were obtained showing complete recanalization of the right MCA vascular tree with slow flow in distal cortical branches (TICI 2C). The guide catheter was retracted into the right common carotid artery. Frontal and lateral angiograms of the neck were obtained. 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 Flat panel CT of the head was obtained and post processed in a separate workstation with concurrent attending physician supervision. Selected images were sent to PACS. No evidence  of hemorrhagic complication. At this point, patient was loaded on Cangrelor followed by continuous drip. Right common  carotid artery angiograms with frontal and lateral views of the head were obtained and used as biplane roadmap. Then, a 2.5-4.8 mm Emboshield NAV 6 cerebral protection device was navigated and deployed into the distal cervical segment of the right ICA. Then, a 4 x 30 mm Viatrac balloon was navigated into the right carotid bulb. Angioplasty was performed under fluoroscopy. The balloon was then removed. Then, a 8-6 x 40 mm XACT carotid stent was deployed spanning the distal right common carotid artery and proximal right internal carotid artery, across the stenosis. In stent angioplasty was then performed with a 6 x 20 mm Viatrac balloon. The cerebral protection device was then recaptured. Right internal carotid artery angiogram with frontal and lateral views of the neck showed adequate stent positioning with no residual stenosis or in stent clot formation. A nonocclusive filling defect is noted in the petrous segment of the right ICA. Right internal carotid artery angiogram with frontal and lateral views of the head showed occlusion of the right M3/MCA superior division branch. Using biplane roadmap guidance, a zoom 71 aspiration catheter was navigated over a phenom 21 microcatheter and a Aristotle 14 microguidewire into the cavernous segment of the right ICA. The microcatheter was then navigated over the wire into the right M3 segment. Then, a 3 x 20 mm solitaire stent retriever was deployed spanning the M3 segment. The device was allowed to intercalated with the clot for 4 minutes. The microcatheter was removed. The aspiration catheter was advanced to the M1 segment and connected to an aspiration pump. The thrombectomy device and aspiration catheter were removed under constant aspiration. Right internal carotid artery angiograms with frontal, lateral, magnified right anterior oblique and magnified lateral views of the head showed recanalization of the M3 branch. A dissection flap is noted in the cavernous segment.  Delayed frontal and lateral angiograms of the head showed nonocclusive clot formation at the level of the dissection. Using biplane roadmap guidance, an SL 10 microcatheter was navigated over an Aristotle 14 micro guidewire into the cavernous segment of the right ACA. Then, a 4 x 24 mm neuroform atlas stent was navigated and deployed in the petrous segment, covering the area of dissection. Magnified frontal and lateral angiograms of the head showed adequate stent positioning covering the dissected segment. Frontal and lateral angiograms of the head showed no evidence of thromboembolic complication with complete opacification of the right MCA vascular tree with slow flow in distal cortical branches (TICI 2C). The guide catheter was retracted into the right common carotid artery. Magnified frontal and lateral angiograms of the neck were obtained, centered on the recently wall stent. The stent is widely open without residual stenosis or evidence of in stent clot formation. The catheter was subsequently withdrawn. Flat panel CT of the head was obtained and post processed in a separate workstation with concurrent attending physician supervision. Selected images were sent to PACS. Small hyperdensity is noted in the right sylvian fissure and in the right high convexity, may represent subarachnoid hemorrhage versus contrast extravasation. Right common femoral artery angiogram was obtained in right anterior oblique view. The puncture is at the level of the common femoral artery. The artery has mild atherosclerotic changes without significant stenosis, adequate for closure device. The sheath was exchanged over the wire for a Perclose prostyle which was utilized for access closure. Immediate hemostasis was achieved. IMPRESSION: 1. Successful mechanical thrombectomy for treatment of a  right M1/MCA occlusion with complete recanalization (TICI 2C). 2. Right carotid bifurcation angioplasty and stenting without residual stenosis.  3. 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 PLAN: Transfer to ICU. Follow-up CT in 3 hours to evaluate for SAH progression. Electronically Signed   By: Pedro Earls M.D.   On: 04/13/2022 10:59   IR INTRAVSC STENT CERV CAROTID W/EMB-PROT MOD SED  Result Date: 04/13/2022 INDICATION: 76 year old male with past medical history significant for emphysema, hypertension, thyroid disease and prior traumatic intracranial hemorrhage; baseline modified Rankin scale 0. He presented to emergency with acute onset left-sided weakness; NIHSS 10. Head CT showed ongoing ischemia in the right insula (ASPECTS 10). 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 Patient received intravenous TNK and was transferred to our service for mechanical thrombectomy. EXAM: ULTRASOUND-GUIDED VASCULAR ACCESS DIAGNOSTIC CEREBRAL ANGIOGRAM MECHANICAL THROMBECTOMY CERVICAL RIGHT CAROTID STENTING WITH CEREBRAL PROTECTION DEVICE INTRACRANIAL STENTING FLAT PANEL HEAD CT (X2) COMPARISON:  CT/CT angiogram of the head and neck April 12, 2022. MEDICATIONS: No antibiotics administered. ANESTHESIA/SEDATION: The procedure was performed under general anesthesia. CONTRAST:  Are and 25 mL of Omnipaque 300 mg/mL FLUOROSCOPY: Radiation Exposure Index (as provided by the fluoroscopic device): 1,027 mGy Kerma COMPLICATIONS: None immediate. TECHNIQUE: Informed written consent was obtained from the patient's wife after a thorough discussion of the procedural risks, benefits and alternatives. All questions were addressed. Maximal Sterile Barrier Technique was utilized including caps, mask, sterile gowns, sterile gloves, sterile drape, hand hygiene and skin antiseptic. A timeout was performed prior to the initiation of the procedure. The right groin was prepped and draped in the usual sterile fashion. Using a  micropuncture kit and the modified Seldinger technique, access was gained to the right common femoral artery and an 8 French sheath was placed. Real-time ultrasound guidance was utilized for vascular access including the acquisition of a permanent ultrasound image documenting patency of the accessed vessel. Under fluoroscopy, a Zoom 88 support guide catheter was navigated over a 6 [place] Berenstein 2 catheter and a 0.035" Terumo Glidewire into the aortic arch. The catheter was placed into the right common carotid artery. Frontal and lateral angiograms of the neck were obtained. The Berenstein 2 catheter was removed. Using biplane roadmap guidance, attempted navigation of a zoom 71 aspiration catheter over a phenom 21 microcatheter and a Aristotle 14 microguidewire into the upper cervical segment of the right ICA proved unsuccessful. The microcatheter and aspiration catheter were removed and the Berenstein 2 catheter was again navigated through the zoom 88 guide catheter and over a 0.035" Terumo Glidewire into the right common carotid artery. The Berenstein 2 catheter was then advanced over the wire into the distal cervical segment of the right ICA. The guide catheter was then advanced into the distal cervical segment of the right ICA. The diagnostic catheter was removed. Frontal and lateral angiograms of the head were obtained. FINDINGS: 1. Normal caliber of the right common femoral artery, adequate for vascular access. 2. 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 3. Atherosclerotic changes along the petrous and cavernous segment of the right internal carotid artery without hemodynamically significant stenosis. 4. 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 PROCEDURE: Using biplane roadmap guidance, a zoom 71 aspiration catheter was navigated over a phenom 21  microcatheter and a  Aristotle 14 microguidewire into the cavernous segment of the right ICA. The microcatheter was then navigated over the wire into the right M2/MCA posterior division branch. Then, a 4 x 40 mm solitaire stent retriever was deployed spanning the M1 and M2 segment. The device was allowed to intercalated with the clot for 4 minutes. The microcatheter was removed. The aspiration catheter was advanced to the level of occlusion and connected to an aspiration pump. The thrombectomy device and aspiration catheter were removed under constant aspiration. Right internal carotid artery angiograms with frontal, lateral, magnified right anterior oblique and magnified lateral views of the head were obtained showing complete recanalization of the right MCA vascular tree with slow flow in distal cortical branches (TICI 2C). The guide catheter was retracted into the right common carotid artery. Frontal and lateral angiograms of the neck were obtained. 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 Flat panel CT of the head was obtained and post processed in a separate workstation with concurrent attending physician supervision. Selected images were sent to PACS. No evidence of hemorrhagic complication. At this point, patient was loaded on Cangrelor followed by continuous drip. Right common carotid artery angiograms with frontal and lateral views of the head were obtained and used as biplane roadmap. Then, a 2.5-4.8 mm Emboshield NAV 6 cerebral protection device was navigated and deployed into the distal cervical segment of the right ICA. Then, a 4 x 30 mm Viatrac balloon was navigated into the right carotid bulb. Angioplasty was performed under fluoroscopy. The balloon was then removed. Then, a 8-6 x 40 mm XACT carotid stent was deployed spanning the distal right common carotid artery and proximal right internal carotid artery, across the stenosis. In  stent angioplasty was then performed with a 6 x 20 mm Viatrac balloon. The cerebral protection device was then recaptured. Right internal carotid artery angiogram with frontal and lateral views of the neck showed adequate stent positioning with no residual stenosis or in stent clot formation. A nonocclusive filling defect is noted in the petrous segment of the right ICA. Right internal carotid artery angiogram with frontal and lateral views of the head showed occlusion of the right M3/MCA superior division branch. Using biplane roadmap guidance, a zoom 71 aspiration catheter was navigated over a phenom 21 microcatheter and a Aristotle 14 microguidewire into the cavernous segment of the right ICA. The microcatheter was then navigated over the wire into the right M3 segment. Then, a 3 x 20 mm solitaire stent retriever was deployed spanning the M3 segment. The device was allowed to intercalated with the clot for 4 minutes. The microcatheter was removed. The aspiration catheter was advanced to the M1 segment and connected to an aspiration pump. The thrombectomy device and aspiration catheter were removed under constant aspiration. Right internal carotid artery angiograms with frontal, lateral, magnified right anterior oblique and magnified lateral views of the head showed recanalization of the M3 branch. A dissection flap is noted in the cavernous segment. Delayed frontal and lateral angiograms of the head showed nonocclusive clot formation at the level of the dissection. Using biplane roadmap guidance, an SL 10 microcatheter was navigated over an Aristotle 14 micro guidewire into the cavernous segment of the right ACA. Then, a 4 x 24 mm neuroform atlas stent was navigated and deployed in the petrous segment, covering the area of dissection. Magnified frontal and lateral angiograms of the head showed adequate stent positioning covering the dissected  segment. Frontal and lateral angiograms of the head showed no evidence  of thromboembolic complication with complete opacification of the right MCA vascular tree with slow flow in distal cortical branches (TICI 2C). The guide catheter was retracted into the right common carotid artery. Magnified frontal and lateral angiograms of the neck were obtained, centered on the recently wall stent. The stent is widely open without residual stenosis or evidence of in stent clot formation. The catheter was subsequently withdrawn. Flat panel CT of the head was obtained and post processed in a separate workstation with concurrent attending physician supervision. Selected images were sent to PACS. Small hyperdensity is noted in the right sylvian fissure and in the right high convexity, may represent subarachnoid hemorrhage versus contrast extravasation. Right common femoral artery angiogram was obtained in right anterior oblique view. The puncture is at the level of the common femoral artery. The artery has mild atherosclerotic changes without significant stenosis, adequate for closure device. The sheath was exchanged over the wire for a Perclose prostyle which was utilized for access closure. Immediate hemostasis was achieved. IMPRESSION: 1. Successful mechanical thrombectomy for treatment of a right M1/MCA occlusion with complete recanalization (TICI 2C). 2. Right carotid bifurcation angioplasty and stenting without residual stenosis. 3. 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 PLAN: Transfer to ICU. Follow-up CT in 3 hours to evaluate for SAH progression. Electronically Signed   By: Pedro Earls M.D.   On: 04/13/2022 10:59   IR Intra Cran Stent  Result Date: 04/13/2022 INDICATION: 76 year old male with past medical history significant for emphysema, hypertension, thyroid disease and prior traumatic intracranial hemorrhage; baseline modified Rankin scale 0. He presented to emergency with acute onset left-sided  weakness; NIHSS 10. Head CT showed ongoing ischemia in the right insula (ASPECTS 10). 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 Patient received intravenous TNK and was transferred to our service for mechanical thrombectomy. EXAM: ULTRASOUND-GUIDED VASCULAR ACCESS DIAGNOSTIC CEREBRAL ANGIOGRAM MECHANICAL THROMBECTOMY CERVICAL RIGHT CAROTID STENTING WITH CEREBRAL PROTECTION DEVICE INTRACRANIAL STENTING FLAT PANEL HEAD CT (X2) COMPARISON:  CT/CT angiogram of the head and neck April 12, 2022. MEDICATIONS: No antibiotics administered. ANESTHESIA/SEDATION: The procedure was performed under general anesthesia. CONTRAST:  Are and 25 mL of Omnipaque 300 mg/mL FLUOROSCOPY: Radiation Exposure Index (as provided by the fluoroscopic device): 6,045 mGy Kerma COMPLICATIONS: None immediate. TECHNIQUE: Informed written consent was obtained from the patient's wife after a thorough discussion of the procedural risks, benefits and alternatives. All questions were addressed. Maximal Sterile Barrier Technique was utilized including caps, mask, sterile gowns, sterile gloves, sterile drape, hand hygiene and skin antiseptic. A timeout was performed prior to the initiation of the procedure. The right groin was prepped and draped in the usual sterile fashion. Using a micropuncture kit and the modified Seldinger technique, access was gained to the right common femoral artery and an 8 French sheath was placed. Real-time ultrasound guidance was utilized for vascular access including the acquisition of a permanent ultrasound image documenting patency of the accessed vessel. Under fluoroscopy, a Zoom 88 support guide catheter was navigated over a 6 [place] Berenstein 2 catheter and a 0.035" Terumo Glidewire into the aortic arch. The catheter was placed into the right common carotid artery. Frontal and lateral angiograms of the neck were obtained. The Berenstein 2  catheter was removed. Using biplane roadmap guidance, attempted navigation of a zoom 71  aspiration catheter over a phenom 21 microcatheter and a Aristotle 14 microguidewire into the upper cervical segment of the right ICA proved unsuccessful. The microcatheter and aspiration catheter were removed and the Berenstein 2 catheter was again navigated through the zoom 88 guide catheter and over a 0.035" Terumo Glidewire into the right common carotid artery. The Berenstein 2 catheter was then advanced over the wire into the distal cervical segment of the right ICA. The guide catheter was then advanced into the distal cervical segment of the right ICA. The diagnostic catheter was removed. Frontal and lateral angiograms of the head were obtained. FINDINGS: 1. Normal caliber of the right common femoral artery, adequate for vascular access. 2. 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 3. Atherosclerotic changes along the petrous and cavernous segment of the right internal carotid artery without hemodynamically significant stenosis. 4. 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 PROCEDURE: Using biplane roadmap guidance, a zoom 71 aspiration catheter was navigated over a phenom 21 microcatheter and a Aristotle 14 microguidewire into the cavernous segment of the right ICA. The microcatheter was then navigated over the wire into the right M2/MCA posterior division branch. Then, a 4 x 40 mm solitaire stent retriever was deployed spanning the M1 and M2 segment. The device was allowed to intercalated with the clot for 4 minutes. The microcatheter was removed. The aspiration catheter was advanced to the level of occlusion and connected to an aspiration pump. The thrombectomy device and aspiration catheter were removed under constant aspiration. 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 The guide catheter was retracted into the right common carotid artery. Frontal and lateral angiograms of the neck were obtained. 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 Flat panel CT of the head was obtained and post processed in a separate workstation with concurrent attending physician supervision. Selected images were sent to PACS. No evidence of hemorrhagic complication. At this point, patient was loaded on Cangrelor followed by continuous drip. Right common carotid artery angiograms with frontal and lateral views of the head were obtained and used as biplane roadmap. Then, a 2.5-4.8 mm Emboshield NAV 6 cerebral protection device was navigated and deployed into the distal cervical segment of the right ICA. Then, a 4 x 30 mm Viatrac balloon was navigated into the right carotid bulb. Angioplasty was performed under fluoroscopy. The balloon was then removed. Then, a 8-6 x 40 mm XACT carotid stent was deployed spanning the distal right common carotid artery and proximal right internal carotid artery, across the stenosis. In stent angioplasty was then performed with a 6 x 20 mm Viatrac balloon. The cerebral protection device was then recaptured. Right internal carotid artery angiogram with frontal and lateral views of the neck showed adequate stent positioning with no residual stenosis or in stent clot formation. A nonocclusive filling defect is noted in the petrous segment of the right ICA. Right internal carotid artery angiogram with frontal and lateral views of the head showed occlusion of the right M3/MCA superior division branch. Using biplane roadmap guidance, a zoom 71 aspiration catheter was navigated over a  phenom 21 microcatheter and a Aristotle 14 microguidewire into  the cavernous segment of the right ICA. The microcatheter was then navigated over the wire into the right M3 segment. Then, a 3 x 20 mm solitaire stent retriever was deployed spanning the M3 segment. The device was allowed to intercalated with the clot for 4 minutes. The microcatheter was removed. The aspiration catheter was advanced to the M1 segment and connected to an aspiration pump. The thrombectomy device and aspiration catheter were removed under constant aspiration. Right internal carotid artery angiograms with frontal, lateral, magnified right anterior oblique and magnified lateral views of the head showed recanalization of the M3 branch. A dissection flap is noted in the cavernous segment. Delayed frontal and lateral angiograms of the head showed nonocclusive clot formation at the level of the dissection. Using biplane roadmap guidance, an SL 10 microcatheter was navigated over an Aristotle 14 micro guidewire into the cavernous segment of the right ACA. Then, a 4 x 24 mm neuroform atlas stent was navigated and deployed in the petrous segment, covering the area of dissection. Magnified frontal and lateral angiograms of the head showed adequate stent positioning covering the dissected segment. Frontal and lateral angiograms of the head showed no evidence of thromboembolic complication with complete opacification of the right MCA vascular tree with slow flow in distal cortical branches (TICI 2C). The guide catheter was retracted into the right common carotid artery. Magnified frontal and lateral angiograms of the neck were obtained, centered on the recently wall stent. The stent is widely open without residual stenosis or evidence of in stent clot formation. The catheter was subsequently withdrawn. Flat panel CT of the head was obtained and post processed in a separate workstation with concurrent attending physician supervision. Selected images  were sent to PACS. Small hyperdensity is noted in the right sylvian fissure and in the right high convexity, may represent subarachnoid hemorrhage versus contrast extravasation. Right common femoral artery angiogram was obtained in right anterior oblique view. The puncture is at the level of the common femoral artery. The artery has mild atherosclerotic changes without significant stenosis, adequate for closure device. The sheath was exchanged over the wire for a Perclose prostyle which was utilized for access closure. Immediate hemostasis was achieved. IMPRESSION: 1. Successful mechanical thrombectomy for treatment of a right M1/MCA occlusion with complete recanalization (TICI 2C). 2. Right carotid bifurcation angioplasty and stenting without residual stenosis. 3. 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 PLAN: Transfer to ICU. Follow-up CT in 3 hours to evaluate for SAH progression. Electronically Signed   By: Pedro Earls M.D.   On: 04/13/2022 10:59   IR CT Head Ltd  Result Date: 04/13/2022 INDICATION: 76 year old male with past medical history significant for emphysema, hypertension, thyroid disease and prior traumatic intracranial hemorrhage; baseline modified Rankin scale 0. He presented to emergency with acute onset left-sided weakness; NIHSS 10. Head CT showed ongoing ischemia in the right insula (ASPECTS 10). 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 Patient received intravenous TNK and was transferred to our service for mechanical thrombectomy. EXAM: ULTRASOUND-GUIDED VASCULAR ACCESS DIAGNOSTIC CEREBRAL ANGIOGRAM MECHANICAL THROMBECTOMY CERVICAL RIGHT CAROTID STENTING WITH CEREBRAL PROTECTION DEVICE INTRACRANIAL STENTING FLAT PANEL HEAD CT (X2) COMPARISON:  CT/CT angiogram of the head and neck April 12, 2022. MEDICATIONS: No antibiotics  administered. ANESTHESIA/SEDATION: The procedure was performed under general anesthesia. CONTRAST:  Are and 25 mL of Omnipaque 300 mg/mL FLUOROSCOPY: Radiation Exposure  Index (as provided by the fluoroscopic device): 1,607 mGy Kerma COMPLICATIONS: None immediate. TECHNIQUE: Informed written consent was obtained from the patient's wife after a thorough discussion of the procedural risks, benefits and alternatives. All questions were addressed. Maximal Sterile Barrier Technique was utilized including caps, mask, sterile gowns, sterile gloves, sterile drape, hand hygiene and skin antiseptic. A timeout was performed prior to the initiation of the procedure. The right groin was prepped and draped in the usual sterile fashion. Using a micropuncture kit and the modified Seldinger technique, access was gained to the right common femoral artery and an 8 French sheath was placed. Real-time ultrasound guidance was utilized for vascular access including the acquisition of a permanent ultrasound image documenting patency of the accessed vessel. Under fluoroscopy, a Zoom 88 support guide catheter was navigated over a 6 [place] Berenstein 2 catheter and a 0.035" Terumo Glidewire into the aortic arch. The catheter was placed into the right common carotid artery. Frontal and lateral angiograms of the neck were obtained. The Berenstein 2 catheter was removed. Using biplane roadmap guidance, attempted navigation of a zoom 71 aspiration catheter over a phenom 21 microcatheter and a Aristotle 14 microguidewire into the upper cervical segment of the right ICA proved unsuccessful. The microcatheter and aspiration catheter were removed and the Berenstein 2 catheter was again navigated through the zoom 88 guide catheter and over a 0.035" Terumo Glidewire into the right common carotid artery. The Berenstein 2 catheter was then advanced over the wire into the distal cervical segment of the right ICA. The guide catheter was then advanced into  the distal cervical segment of the right ICA. The diagnostic catheter was removed. Frontal and lateral angiograms of the head were obtained. FINDINGS: 1. Normal caliber of the right common femoral artery, adequate for vascular access. 2. 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 3. Atherosclerotic changes along the petrous and cavernous segment of the right internal carotid artery without hemodynamically significant stenosis. 4. 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 PROCEDURE: Using biplane roadmap guidance, a zoom 71 aspiration catheter was navigated over a phenom 21 microcatheter and a Aristotle 14 microguidewire into the cavernous segment of the right ICA. The microcatheter was then navigated over the wire into the right M2/MCA posterior division branch. Then, a 4 x 40 mm solitaire stent retriever was deployed spanning the M1 and M2 segment. The device was allowed to intercalated with the clot for 4 minutes. The microcatheter was removed. The aspiration catheter was advanced to the level of occlusion and connected to an aspiration pump. The thrombectomy device and aspiration catheter were removed under constant aspiration. Right internal carotid artery angiograms with frontal, lateral, magnified right anterior oblique and magnified lateral views of the head were obtained showing complete recanalization of the right MCA vascular tree with slow flow in distal cortical branches (TICI 2C). The guide catheter was retracted into the right common carotid artery. Frontal and lateral angiograms of the neck were obtained. 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 Flat panel CT of the head was obtained and post processed in a separate workstation with concurrent attending physician supervision.  Selected images were sent to PACS. No evidence of hemorrhagic complication. At this point, patient was loaded on Cangrelor followed by continuous drip. Right common carotid artery angiograms with frontal and lateral views of  the head were obtained and used as biplane roadmap. Then, a 2.5-4.8 mm Emboshield NAV 6 cerebral protection device was navigated and deployed into the distal cervical segment of the right ICA. Then, a 4 x 30 mm Viatrac balloon was navigated into the right carotid bulb. Angioplasty was performed under fluoroscopy. The balloon was then removed. Then, a 8-6 x 40 mm XACT carotid stent was deployed spanning the distal right common carotid artery and proximal right internal carotid artery, across the stenosis. In stent angioplasty was then performed with a 6 x 20 mm Viatrac balloon. The cerebral protection device was then recaptured. Right internal carotid artery angiogram with frontal and lateral views of the neck showed adequate stent positioning with no residual stenosis or in stent clot formation. A nonocclusive filling defect is noted in the petrous segment of the right ICA. Right internal carotid artery angiogram with frontal and lateral views of the head showed occlusion of the right M3/MCA superior division branch. Using biplane roadmap guidance, a zoom 71 aspiration catheter was navigated over a phenom 21 microcatheter and a Aristotle 14 microguidewire into the cavernous segment of the right ICA. The microcatheter was then navigated over the wire into the right M3 segment. Then, a 3 x 20 mm solitaire stent retriever was deployed spanning the M3 segment. The device was allowed to intercalated with the clot for 4 minutes. The microcatheter was removed. The aspiration catheter was advanced to the M1 segment and connected to an aspiration pump. The thrombectomy device and aspiration catheter were removed under constant aspiration. Right internal carotid artery angiograms with frontal, lateral,  magnified right anterior oblique and magnified lateral views of the head showed recanalization of the M3 branch. A dissection flap is noted in the cavernous segment. Delayed frontal and lateral angiograms of the head showed nonocclusive clot formation at the level of the dissection. Using biplane roadmap guidance, an SL 10 microcatheter was navigated over an Aristotle 14 micro guidewire into the cavernous segment of the right ACA. Then, a 4 x 24 mm neuroform atlas stent was navigated and deployed in the petrous segment, covering the area of dissection. Magnified frontal and lateral angiograms of the head showed adequate stent positioning covering the dissected segment. Frontal and lateral angiograms of the head showed no evidence of thromboembolic complication with complete opacification of the right MCA vascular tree with slow flow in distal cortical branches (TICI 2C). The guide catheter was retracted into the right common carotid artery. Magnified frontal and lateral angiograms of the neck were obtained, centered on the recently wall stent. The stent is widely open without residual stenosis or evidence of in stent clot formation. The catheter was subsequently withdrawn. Flat panel CT of the head was obtained and post processed in a separate workstation with concurrent attending physician supervision. Selected images were sent to PACS. Small hyperdensity is noted in the right sylvian fissure and in the right high convexity, may represent subarachnoid hemorrhage versus contrast extravasation. Right common femoral artery angiogram was obtained in right anterior oblique view. The puncture is at the level of the common femoral artery. The artery has mild atherosclerotic changes without significant stenosis, adequate for closure device. The sheath was exchanged over the wire for a Perclose prostyle which was utilized for access closure. Immediate hemostasis was achieved. IMPRESSION: 1. Successful mechanical  thrombectomy for treatment of a right M1/MCA occlusion with complete recanalization (TICI 2C). 2. Right carotid bifurcation angioplasty and stenting without residual stenosis. 3. Intracranial stenting of the petrous segment of  the right ICA for treatment of iatrogenic dissection. PLAN: Transfer to ICU. Follow-up CT in 3 hours to evaluate for SAH progression. Electronically Signed   By: Pedro Earls M.D.   On: 04/13/2022 10:59   MR BRAIN WO CONTRAST  Result Date: 04/13/2022 CLINICAL DATA:  Stroke follow-up EXAM: MRI HEAD WITHOUT CONTRAST TECHNIQUE: Multiplanar, multiecho pulse sequences of the brain and surrounding structures were obtained without intravenous contrast. COMPARISON:  Head CT from yesterday FINDINGS: Brain: Patchy restricted diffusion along the right cerebral cortex, greatest at the anterior insula and frontal operculum. Punctate areas of restricted diffusion in the bilateral medial temporal lobe close to the hippocampi, PCA distribution. Mild restricted diffusion in the right caudate nucleus best seen on FLAIR imaging there is hyperintensity within sulci of the right cerebral convexity, correlating with high-density areas by prior CT. Vascular: Major flow voids are preserved Skull and upper cervical spine: Normal marrow signal Sinuses/Orbits: Negative IMPRESSION: 1. 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 Small infarcts also seen in the bilateral medial temporal lobes. 2. Subarachnoid hemorrhage along the right cerebral convexity similar to prior head CT. Electronically Signed   By: Jorje Guild M.D.   On: 04/13/2022 05:48   CT HEAD WO CONTRAST (5MM)  Result Date: 04/12/2022 CLINICAL DATA:  Status post mechanical thrombectomy, follow-up subarachnoid hemorrhage EXAM: CT HEAD WITHOUT CONTRAST TECHNIQUE: Contiguous axial images were obtained from the base of the skull through the vertex without intravenous  contrast. RADIATION DOSE REDUCTION: This exam was performed according to the departmental dose-optimization program which includes automated exposure control, adjustment of the mA and/or kV according to patient size and/or use of iterative reconstruction technique. COMPARISON:  04/12/2022 CT head 11:21 a.m. and 04/12/2022 CT head 8:31 a.m. FINDINGS: Evaluation is somewhat limited by motion artifact. Brain: Subarachnoid hyperdense material in the right frontal, temporal, and frontoparietal regions (series 7, images 7, 13, and 20, respectively), which appears similar in size and distribution to the CT done immediately post thrombectomy. Additional hyperdense material in the right inferior frontal lobe is also favored to be subarachnoid and appears less conspicuous than on the post thrombectomy CT. Previously noted loss of gray-white differentiation in the right insula is less conspicuous, possibly due to the hyperdense material. No additional acute infarct, mass, mass effect, or midline shift. No hydrocephalus. Vascular: Residual contrast is noted in the blood vessels. No definite hyperdense vessel. Atherosclerotic calcifications in the intracranial carotid and vertebral arteries. Skull: Negative for fracture or focal lesion. Sinuses/Orbits: No acute finding. Other: The mastoids are well aerated. IMPRESSION: 1. Subarachnoid hyperdense material in the right cerebral hemisphere is favored to represent a small amount of subarachnoid hemorrhage, which appears similar to the immediate post thrombectomy CT. This could also represent contrast staining. 2. No additional acute intracranial process. Electronically Signed   By: Merilyn Baba M.D.   On: 04/12/2022 14:36   ECHOCARDIOGRAM COMPLETE  Result Date: 04/12/2022    ECHOCARDIOGRAM REPORT   Patient Name:   Alexander Stewart Date of Exam: 04/12/2022 Medical Rec #:  [id number]    Height:       67.5 in Accession #:    [id number]   Weight:       160.1 lb Date of Birth:  08-08-1945    BSA:          1.850 m Patient Age:    83 years     BP:  105/90 mmHg Patient Gender: M            HR:           76 bpm. Exam Location:  Inpatient Procedure: 2D Echo Indications:    stroke  History:        Patient has no prior history of Echocardiogram examinations.                 Stroke; Risk Factors:Hypertension, Former Smoker and emphysema.  Sonographer:    Harvie Junior Referring Phys: [phone] ERIC LINDZEN  Sonographer Comments: Technically difficult study due to poor echo windows, suboptimal parasternal window, suboptimal apical window and suboptimal subcostal window. Image acquisition challenging due to uncooperative patient, Image acquisition challenging  due to respiratory motion and supine. IMPRESSIONS  1. Left ventricular ejection fraction, by estimation, is 60 to 65%. The left ventricle has normal function. The left ventricle has no regional wall motion abnormalities. Left ventricular diastolic parameters are consistent with Grade I diastolic dysfunction (impaired relaxation).  2. Right ventricular systolic function is normal. The right ventricular size is normal. Tricuspid regurgitation signal is inadequate for assessing PA pressure.  3. The mitral valve is normal in structure. No evidence of mitral valve regurgitation. No evidence of mitral stenosis.  4. The aortic valve is normal in structure. Aortic valve regurgitation is not visualized. No aortic stenosis is present.  5. The inferior vena cava is normal in size with greater than 50% respiratory variability, suggesting right atrial pressure of 3 mmHg. Conclusion(s)/Recommendation(s): No intracardiac source of embolism detected on this transthoracic study. Consider a transesophageal echocardiogram to exclude cardiac source of embolism if clinically indicated. FINDINGS  Left Ventricle: Left ventricular ejection fraction, by estimation, is 60 to 65%. The left ventricle has normal function. The left ventricle has no regional wall motion  abnormalities. The left ventricular internal cavity size was normal in size. There is  no left ventricular hypertrophy. Left ventricular diastolic parameters are consistent with Grade I diastolic dysfunction (impaired relaxation). Normal left ventricular filling pressure. Right Ventricle: The right ventricular size is normal. No increase in right ventricular wall thickness. Right ventricular systolic function is normal. Tricuspid regurgitation signal is inadequate for assessing PA pressure. Left Atrium: Left atrial size was normal in size. Right Atrium: Right atrial size was normal in size. Pericardium: There is no evidence of pericardial effusion. Mitral Valve: The mitral valve is normal in structure. No evidence of mitral valve regurgitation. No evidence of mitral valve stenosis. Tricuspid Valve: The tricuspid valve is normal in structure. Tricuspid valve regurgitation is not demonstrated. No evidence of tricuspid stenosis. Aortic Valve: The aortic valve is normal in structure. Aortic valve regurgitation is not visualized. No aortic stenosis is present. Aortic valve mean gradient measures 3.0 mmHg. Aortic valve peak gradient measures 4.2 mmHg. Aortic valve area, by VTI measures 2.43 cm. Pulmonic Valve: The pulmonic valve was normal in structure. Pulmonic valve regurgitation is not visualized. No evidence of pulmonic stenosis. Aorta: The aortic root is normal in size and structure. Venous: The inferior vena cava is normal in size with greater than 50% respiratory variability, suggesting right atrial pressure of 3 mmHg. IAS/Shunts: The interatrial septum appears to be lipomatous. No atrial level shunt detected by color flow Doppler.  LEFT VENTRICLE PLAX 2D LVIDd:         3.10 cm     Diastology LVIDs:         2.30 cm     LV e' medial:    5.55 cm/s LV  PW:         0.90 cm     LV E/e' medial:  8.6 LV IVS:        1.00 cm     LV e' lateral:   7.51 cm/s LVOT diam:     1.80 cm     LV E/e' lateral: 6.3 LV SV:         52 LV  SV Index:   28 LVOT Area:     2.54 cm  LV Volumes (MOD) LV vol d, MOD A2C: 44.9 ml LV vol d, MOD A4C: 59.7 ml LV vol s, MOD A2C: 18.3 ml LV vol s, MOD A4C: 21.2 ml LV SV MOD A2C:     26.6 ml LV SV MOD A4C:     59.7 ml LV SV MOD BP:      31.5 ml RIGHT VENTRICLE RV S prime:     15.40 cm/s TAPSE (M-mode): 2.1 cm LEFT ATRIUM           Index LA Vol (A4C): 21.6 ml 11.68 ml/m  AORTIC VALVE AV Area (Vmax):    2.74 cm AV Area (Vmean):   2.25 cm AV Area (VTI):     2.43 cm AV Vmax:           103.00 cm/s AV Vmean:          81.300 cm/s AV VTI:            0.214 m AV Peak Grad:      4.2 mmHg AV Mean Grad:      3.0 mmHg LVOT Vmax:         111.00 cm/s LVOT Vmean:        71.800 cm/s LVOT VTI:          0.204 m LVOT/AV VTI ratio: 0.95  AORTA Ao Root diam: 3.10 cm MITRAL VALVE MV Area (PHT): 3.77 cm    SHUNTS MV Decel Time: 201 msec    Systemic VTI:  0.20 m MV E velocity: 47.60 cm/s  Systemic Diam: 1.80 cm MV A velocity: 96.40 cm/s MV E/A ratio:  0.49 Fransico Him MD Electronically signed by Fransico Him MD Signature Date/Time: 04/12/2022/1:46:08 PM    Final    CT ANGIO HEAD NECK W WO CM (CODE STROKE)  Result Date: 04/12/2022 CLINICAL DATA:  Neuro deficit, acute, stroke suspected. EXAM: CT ANGIOGRAPHY HEAD AND NECK TECHNIQUE: Multidetector CT imaging of the head and neck was performed using the standard protocol during bolus administration of intravenous contrast. Multiplanar CT image reconstructions and MIPs were obtained to evaluate the vascular anatomy. Carotid stenosis measurements (when applicable) are obtained utilizing NASCET criteria, using the distal internal carotid diameter as the denominator. RADIATION DOSE REDUCTION: This exam was performed according to the departmental dose-optimization program which includes automated exposure control, adjustment of the mA and/or kV according to patient size and/or use of iterative reconstruction technique. CONTRAST:  49m OMNIPAQUE IOHEXOL 350 MG/ML SOLN COMPARISON:  Same day  noncontrast head CT 04/12/2022. FINDINGS: CTA NECK FINDINGS Aortic arch: The origins of the innominate, left common carotid and left subclavian arteries are excluded from the field of view. Atherosclerotic plaque within the visualized aortic arch and proximal major branch vessels of the neck. Streak artifact from a dense right-sided contrast bolus partially obscures the right subclavian artery. No hemodynamically significant stenosis within the visible innominate or proximal subclavian arteries. Right carotid system: CCA patent. Mild atherosclerotic plaque within this vessel without hemodynamically significant stenosis. Atherosclerotic plaque at the carotid bifurcation  and within the proximal ICA. The ICA becomes occluded shortly beyond its origin and remains occluded throughout the remainder of the neck. Left carotid system: The origin of the CCA is excluded from the field of view. The visualized CCA is patent with atherosclerotic plaque, but without hemodynamically significant stenosis. Atherosclerotic plaque at the carotid bifurcation and within the proximal ICA. No hemodynamically significant stenosis within the proximal ICA. 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 Vertebral arteries: Vertebral arteries patent within the neck without stenosis. The left vertebral artery is dominant. Skeleton: Cervical spine CT reported separately. Other neck: No neck mass or cervical lymphadenopathy. Upper chest: Emphysema. No consolidation within the imaged lung apices. Review of the MIP images confirms the above findings CTA HEAD FINDINGS Anterior circulation: The right ICA remains occluded to the level of the distal cavernous segment. Subocclusive thrombus is present within the distal cavernous segment (for instance as seen on series 7, image 111). There is more robust reconstitution of enhancement at the paraclinoid and supraclinoid level. There is abrupt occlusion of the right middle cerebral artery at the proximal M1 segment  level. There is some enhancement of M2 and more distal right MCA vessels, although asymmetrically diminished as compared to fat on the left. The intracranial left ICA is patent. Scattered atherosclerotic plaque within this vessel without stenosis. The M1 left middle cerebral artery is patent. No left M2 proximal branch occlusion. The anterior cerebral arteries are patent. No intracranial aneurysm is identified. 1-2 mm posteriorly inferiorly projecting vascular protrusion arising from the paraclinoid right ICA, which may reflect an aneurysm (series 9, image 84). Posterior circulation: The intracranial vertebral arteries are patent. Calcified atherosclerotic plaque within the left V4 segment with no more than mild stenosis. The basilar artery is patent. The posterior cerebral arteries are patent. Posterior communicating arteries are present, bilaterally Venous sinuses: Within the limitations of contrast timing, no convincing thrombus. Anatomic variants: None significant. Review of the MIP images confirms the above findings Critical/emergent results were called by telephone at the time of interpretation on 04/12/2022 at 8:40 am to provider [facility] , who verbally acknowledged these results. IMPRESSION: CTA neck: 1. The origins of the innominate artery, left common carotid artery and left subclavian artery are excluded from the field of view. 2. The right ICA becomes occluded just beyond its origin, and remains occluded throughout the remainder of the neck. Although the appearance is nonspecific, right ICA dissection is a consideration. 3. Within described limitations, there is no hemodynamically significant stenosis of the left common carotid or cervical internal carotid arteries. 4. Vertebral arteries patent within the neck without stenosis. 5. 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 6.  Aortic Atherosclerosis (ICD10-I70.0). CTA head: 1. The intracranial right ICA remains occluded to the distal cavernous  segment. Subocclusive thrombus is present within the distal cavernous segment. There is more robust reconstitution of right ICA enhancement at the paraclinoid and supraclinoid level. However, there is abrupt occlusion of the right middle cerebral artery at the proximal M1 segment level. 2. 1-2 mm posteroinferiorly projecting vascular protrusion arising from the paraclinoid right ICA, which may reflect an aneurysm. Electronically Signed   By: Kellie Simmering D.O.   On: 04/12/2022 09:12   CT C-SPINE NO CHARGE  Result Date: 04/12/2022 CLINICAL DATA:  Unwitnessed fall. EXAM: CT Cervical Spine without contrast TECHNIQUE: Multiplanar CT images of the cervical spine were reconstructed from contemporary CT of the Neck. RADIATION DOSE REDUCTION: This exam was performed according to the departmental dose-optimization program which includes automated  exposure control, adjustment of the mA and/or kV according to patient size and/or use of iterative reconstruction technique. CONTRAST:  None or No additional COMPARISON:  None Available. FINDINGS: Alignment: Anatomic alignment.  No static listhesis. Skull base and vertebrae: No acute fracture. No aggressive lytic or sclerotic osseous lesion. Soft tissues and spinal canal: Intraspinal soft tissues are not fully imaged on this examination due to poor soft tissue contrast, but there is no gross soft tissue abnormality. No prevertebral fluid or swelling. No visible canal hematoma. Disc levels: Degenerative disease with disc height loss at C5-6, C6-7 and to lesser extent C7-T1 and C4-5. Upper chest: Lung apices are clear.  Centrilobular emphysema. Other: No fluid collection or hematoma. IMPRESSION: 1.  No acute osseous injury of the cervical spine. 2. Cervical spine spondylosis as described above. 3.  Emphysema (ICD10-J43.9). Electronically Signed   By: Kathreen Devoid M.D.   On: 04/12/2022 08:53   CT HEAD CODE STROKE WO CONTRAST  Result Date: 04/12/2022 CLINICAL DATA:  Code stroke.  Neuro deficit, acute, stroke suspected. EXAM: CT HEAD WITHOUT CONTRAST TECHNIQUE: Contiguous axial images were obtained from the base of the skull through the vertex without intravenous contrast. RADIATION DOSE REDUCTION: This exam was performed according to the departmental dose-optimization program which includes automated exposure control, adjustment of the mA and/or kV according to patient size and/or use of iterative reconstruction technique. COMPARISON:  No pertinent prior exams available for comparison. FINDINGS: Brain: Mild generalized parenchymal atrophy. Loss of gray-white differentiation within the right insula compatible with an acute right MCA territory infarct (for instance as seen on series 2, images 15 and 16). There is no acute intracranial hemorrhage. No extra-axial fluid collection. No evidence of an intracranial mass. No midline shift. Vascular: Atherosclerotic calcifications. Asymmetric hyperdensity of the M1 right middle cerebral artery suspicious for the presence of endoluminal thrombus. Skull: No fracture or aggressive osseous lesion. Sinuses/Orbits: No mass or acute finding within the imaged orbits. Mucosal thickening within the bilateral maxillary sinuses at the imaged levels (trace right, mild left). ASPECTS ([place] Stroke Program Early CT Score) - Ganglionic level infarction (caudate, lentiform nuclei, internal capsule, insula, M1-M3 cortex): 6 - Supraganglionic infarction (M4-M6 cortex): 3 Total score (0-10 with 10 being normal): 9 Critical/emergent results were called by telephone at the time of interpretation on 04/12/2022 at 8:38 am to provider Dr. Cheral Marker, who verbally acknowledged these results. 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 ASPECTS is 9. Asymmetric hyperdensity of the M1 right middle cerebral artery suspicious for the presence of endoluminal thrombus. Correlate with findings on pending CTA head/neck. Mild generalized parenchymal  atrophy. Electronically Signed   By: Kellie Simmering D.O.   On: 04/12/2022 08:43      HISTORY OF PRESENT ILLNESS Alexander Stewart is an 76 y.o. 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 Wife found him awake on the floor in his bathroom without obvious signs of trauma. No seizure activity noted by family or EMS. Per EMS he was alert and oriented x 4. Fully functional at baseline with an mRS of 0.    Vitals per EMS: 164/80, HR 72, RR 14, 98% RA, CBG 128.   He has no prior history of stroke or MI. He is not on a blood thinner.   HOSPITAL COURSE Patient with a history of HTN, emphysema, thyroid disease  and traumatic ICH presented with acute onset left sided weakness. Patient was given TNK and taken to IR to treat his stroke.  Thrombectomy was performed to the right MCA, and stent was placed in the right carotid artery.  He will need 30 day cardiac monitoring on discharge as stroke is potentially cardioembolic and patient declines loop recorder.  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  ASPECTS 10.    CTA head & neck The right ICA becomes occluded just beyond its origin, and remains occluded throughout the remainder of the neck. right middle cerebral artery at the proximal M1 segment level. IR with TICI3 but reocclusion of R M3 and dissection of R ICA petrous portion. 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 Small infarcts  also seen in the bilateral medial temporal lobes. Subarachnoid hemorrhage along the right cerebral convexity similar to prior head CT. 2D Echo 60-65% Recommend 30 day monitoring on discharge.  LDL 76 HgbA1c 5.6 VTE prophylaxis - SCDs No antithrombotic prior to admission, now on aspirin 81 mg daily and Brilinta (ticagrelor) 90 mg bid for carotid stenting.  Therapy recommendations:  no PT/OT follow up Disposition:  Pending   Hx of traumatic ICH Hx of loop recorder Syncope episode in 2016 in [place], full down from roof with TBI Was told to have traumatic ICH, but could be SAH too. He stated that he was discharged in 2 days.  No cause found for syncope, so loop recorder placed. Pt not happy with the loop monitoring cost, he stopped follow up for loop Moved here in 2017 and had loop explaned in [facility] in 2022 No data can be retrieved at the time of explant.  Pt not interested for another loop at this time. Given current stroke, recommend 30 day monitoring as outpt   Hypertension Home meds:  Metoprolol succinate 8m Stable BP goal < 180/105 post TNK Long-term BP goal normotensive   Hyperlipidemia Home meds:  Lovastatin 139m resumed in hospital LDL 76, goal < 70 On crestor 2068montinue statin at discharge   Other Stroke Risk Factors Advanced Age >/= 65 48 DISCHARGE EXAM Blood pressure (!) 143/71, pulse 75, temperature 98.7 F (37.1 C), temperature source Axillary, resp. rate 17, height 5' 7.5" (1.715 m), weight 72.6 kg, SpO2 97 %. General:  Alert, well-developed, well-nourished patient in no acute distress Respiratory:  Regular, unlabored respirations on room air  NEURO:  Mental Status: AA&Ox3  Speech/Language: speech is without dysarthria or aphasia.  Fluency, and comprehension intact.  Cranial Nerves:  II: PERRL. Visual fields full.  III, IV, VI: EOMI. Eyelids elevate symmetrically.  V: Sensation is intact to light touch and symmetrical to face.  VII: Smile is  symmetrical.  VIII: hearing intact to voice. IX, X: Phonation is normal.  XII: tongue is midline without fasciculations. Motor: 5/5 strength to all muscle groups tested. Postural hand tremors noted Tone: is normal and bulk is normal Sensation- Intact to light touch bilaterally.  Coordination: FTN intact bilaterally, HKS: no ataxia in BLE.No drift.  Gait- deferred    Discharge  Diet       Diet   Diet Heart Room service appropriate? Yes with Assist; Fluid consistency: Thin   liquids  DISCHARGE PLAN Disposition:  home aspirin 81 mg daily and Brilinta (ticagrelor) 90 mg bid for secondary stroke prevention for 3 months then per interventional radiologist. Ongoing stroke risk factor control by Primary Care Physician at time of discharge Follow-up PCP Patrecia Pour, Christean Grief, MD in 2 weeks. Follow-up in [facility] in 4 weeks, office to schedule an appointment.   35 minutes were spent preparing discharge.  [place] , MSN, AGACNP-BC [facility] See Amion for schedule and pager information 04/14/2022 12:23 PM  ATTENDING NOTE: I reviewed above note and agree with the assessment and plan. Pt was seen and examined.   No family at bedside.  Patient lying in bed, awake alert, orientated x3, neurologically intact except psychomotor slowing.  Discussed with patient about putting loop recorder again this time for A-fib evaluation, patient refused.  Will recommend 30-day CardioNet monitor as outpatient.  Continue DAPT and statin.  Patient will be discharged in good condition.  Follow-up at [facility] in 4 weeks.  For detailed assessment and plan, please refer to above/below as I have made changes wherever appropriate.   Rosalin Hawking, MD PhD Stroke Neurology 04/14/2022 7:36 PM

## 2022-04-14 NOTE — Progress Notes (Signed)
  Transition of Care [facility]) Screening Note   Patient Details  Name: Alexander Stewart Date of Birth: [birth year]   Transition of Care [facility]) CM/SW Contact:    Benard Halsted, LCSW Phone Number: 04/14/2022, 9:57 AM    Transition of Care Department [facility]) has reviewed patient and no TOC needs have been identified at this time. We will continue to monitor patient advancement through interdisciplinary progression rounds. If new patient transition needs arise, please place a TOC consult.

## 2022-04-26 ENCOUNTER — Ambulatory Visit: Payer: Medicare Other | Attending: Internal Medicine

## 2022-04-26 DIAGNOSIS — I4891 Unspecified atrial fibrillation: Secondary | ICD-10-CM

## 2022-04-26 DIAGNOSIS — I639 Cerebral infarction, unspecified: Secondary | ICD-10-CM | POA: Diagnosis not present

## 2022-05-19 NOTE — Progress Notes (Signed)
[facility] [address] [place]. [place] [address] [phone]       HOSPITAL FOLLOW UP NOTE  Mr. Alexander Stewart Date of Birth:  1946/06/21 Medical Record Number:  [id number]   Reason for Referral:  hospital stroke follow up    SUBJECTIVE:   CHIEF COMPLAINT:  Chief Complaint  Patient presents with   Follow-up    RM 3 With spouse Alexander Stewart Pt is well and stable, no concerns     HPI:   Alexander Stewart is an 76 y.o. male with a history of [place] secondary to a fall in 2016, HTN, emphysema and thyroid disease who presented on 04/12/2022 with acute onset of left-sided weakness.  Evaluated by Dr. Erlinda Hong with patchy acute infarcts in the MCA distribution and small infarcts also seen bilateral medial temporal lobes with right ICA and M1 occlusion s/p TNK and IR with TICI 3 reperfusion and right ICA stenting, etiology unclear but concerning for cardioembolic source.  Placed on aspirin and Brilinta post carotid stenting for at least 3 months then per IR.  Recommended placement of loop recorder but patient declined (as he had prior ILR placed after syncopal event  in 2016, explanted 2017 due to cost) therefore recommended 30-day cardiac event monitoring.  LDL 76 -started on Crestor 20 mg daily.  Evaluated by therapies without therapy needs and discharged home.   Today, 05/24/2022, patient is being seen for initial hospital follow-up accompanied by his wife.  He has been doing well since discharge, denies any residual deficits and denies any new stroke/TIA symptoms.  He has returned back to all prior activities.  Remains on aspirin and Brilinta as well as Crestor, denies side effects Blood pressure well controlled, today 121/75, monitors at home and has been stable Currently wearing cardiac monitor, completion of 30 days today, has f/u with cardiology on Wednesday      PERTINENT IMAGING  Per hospitalization 04/12/2022 -04/14/2022 Code Stroke CT head No acute abnormality.  ASPECTS  10.    CTA head & neck The right ICA becomes occluded just beyond its origin, and remains occluded throughout the remainder of the neck. right middle cerebral artery at the proximal M1 segment level. IR with TICI3 but reocclusion of R M3 and dissection of R ICA petrous portion. 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 Small infarcts also seen in the bilateral medial temporal lobes. Subarachnoid hemorrhage along the right cerebral convexity similar to prior head CT. 2D Echo 60-65%  LDL 76 HgbA1c 5.6    ROS:   14 system review of systems performed and negative with exception of those listed in HPI  PMH:  Past Medical History:  Diagnosis Date   Emphysema lung ([place])    Hypertension    Thyroid disease     PSH:  Past Surgical History:  Procedure Laterality Date   IR CT HEAD LTD  04/12/2022   IR CT HEAD LTD  04/12/2022   IR INTRA CRAN STENT  04/12/2022   IR INTRAVSC STENT CERV CAROTID W/EMB-PROT MOD SED INCL ANGIO  04/12/2022   IR PERCUTANEOUS ART THROMBECTOMY/INFUSION INTRACRANIAL INC DIAG ANGIO  04/12/2022   IR US GUIDE VASC ACCESS RIGHT  04/12/2022   RADIOLOGY WITH ANESTHESIA N/A 04/12/2022   Procedure: IR WITH ANESTHESIA;  Surgeon: Radiologist, Medication, MD;  Location: [facility];  Service: Radiology;  Laterality: N/A;    Social History:  Social History   Socioeconomic History   Marital status: Married    Spouse name: Not on file   Number of children: Not on file   Years of education: Not on file   Highest education level: Not on file  Occupational History   Not on file  Tobacco Use   Smoking status: Former   Smokeless tobacco: Never  Substance and Sexual Activity   Alcohol use: Yes   Drug use: Never   Sexual activity: Not on file  Other Topics Concern   Not on file  Social History  Narrative   Not on file   Social Determinants of Health   Financial Resource Strain: Not on file  Food Insecurity: No Food Insecurity (04/14/2022)   Hunger Vital Sign    Worried About Running Out of Food in the Last Year: Never true    Ran Out of Food in the Last Year: Never true  Transportation Needs: No Transportation Needs (04/14/2022)   PRAPARE - Administrator, Civil Service (Medical): No    Lack of Transportation (Non-Medical): No  Physical Activity: Not on file  Stress: Not on file  Social Connections: Not on file  Intimate Partner Violence: Not At Risk (04/14/2022)   Humiliation, Afraid, Rape, and Kick questionnaire    Fear of Current or Ex-Partner: No    Emotionally Abused: No    Physically Abused: No    Sexually Abused: No    Family History: History reviewed. No pertinent family history.  Medications:   Current Outpatient Medications on File Prior to Visit  Medication Sig Dispense Refill   aspirin EC 81 MG tablet Take 1 tablet (81 mg total) by mouth daily. Swallow whole. 30 tablet 12   Cholecalciferol (VITAMIN D3 PO) Take 1 capsule by mouth daily.     metoprolol succinate (TOPROL-XL) 25 MG 24 hr tablet Take 25 mg by mouth daily.     Multiple Vitamin (MULTIVITAMIN WITH MINERALS) TABS tablet Take 1 tablet by mouth daily.     rosuvastatin (CRESTOR) 20 MG tablet Take 1 tablet (20 mg total) by mouth daily. 30 tablet 1   ticagrelor (BRILINTA) 90 MG TABS tablet Take 1 tablet (90 mg total) by mouth 2 (two) times daily. 60 tablet 2   vitamin B-12 (CYANOCOBALAMIN) 100 MCG tablet Take 100 mcg by mouth daily.     vitamin E 1000 UNIT capsule Take 1,000 Units by mouth daily.     No current facility-administered medications on file prior to visit.    Allergies:  No Known Allergies    OBJECTIVE:  Physical Exam  Vitals:   05/24/22 1006  BP: 121/75  Pulse: (!) 58  Weight: 155 lb (70.3 kg)  Height: 5\' 7"  (1.702 m)   Body mass index is 24.28 kg/m. No results  found.   General: well developed, well nourished, very pleasant elderly Caucasian male, seated, in no evident distress Head: head normocephalic and atraumatic.   Neck: supple with no carotid or supraclavicular bruits Cardiovascular: regular rate and rhythm, no murmurs Musculoskeletal: no deformity Skin:  no rash/petichiae Vascular:  Normal pulses all extremities   Neurologic Exam Mental Status: Awake and fully alert.  Fluent speech and language.  Oriented to place and time. Recent and remote memory intact. Attention span, concentration and fund of knowledge appropriate. Mood and affect appropriate.  Cranial Nerves: Fundoscopic exam reveals sharp disc margins. Pupils equal, briskly reactive to light. Extraocular movements full without nystagmus. Visual  fields full to confrontation. Hearing intact. Facial sensation intact. Face, tongue, palate moves normally and symmetrically.  Motor: Normal bulk and tone. Normal strength in all tested extremity muscles Sensory.: intact to touch , pinprick , position and vibratory sensation.  Coordination: Rapid alternating movements normal in all extremities. Finger-to-nose and heel-to-shin performed accurately bilaterally. Gait and Station: Arises from chair without difficulty. Stance is normal. Gait demonstrates normal stride length and balance without use of AD. Tandem walk and heel toe mild difficulty.  Reflexes: 1+ and symmetric. Toes downgoing.     NIHSS  0 Modified Rankin  0      ASSESSMENT: Alexander Stewart is a 76 y.o. year old male with patchy acute infarcts in MCA distribution and small infarcts also seen in bilateral medial temporal lobes with right ICA and M1 occlusion s/p TNK and IR with TICI 3 reperfusion and right ICA stenting 04/12/2022, etiology unclear but concerning for cardioembolic source. Vascular risk factors include HTN, HLD, hx of traumatic ICH and loop recorder after syncopal event in 2016 (pt chose to have ILR explanted 2017 d/t  cost) and advanced age.      PLAN:  Cryptogenic stroke:  Recovered well without residual deficit Cardiac monitor completed today, has f/u with cards Wednesday Continue aspirin 81mg  daily and Brilinta and Crestor 20 mg daily for secondary stroke prevention.   Plans on follow-up visit with IR in 3 months post right ICA stenting, per Dr. continue Brilinta for at least 3 months then per IR recommendations, ongoing refills can be obtained by IR/PCP Discussed secondary stroke prevention measures and importance of close PCP follow up for aggressive stroke risk factor management including BP goal<130/90, HLD with LDL goal<70 and DM with A1c.<7 .  Stroke labs 03/2022: LDL 76, A1c 5.6 I have gone over the pathophysiology of stroke, warning signs and symptoms, risk factors and their management in some detail with instructions to go to the closest emergency room for symptoms of concern.    Doing well from stroke standpoint without further recommendations and risk factors are managed by PCP. He may follow up PRN, as usual for our patients who are strictly being followed for stroke. If any new neurological issues should arise, request PCP place referral for evaluation by one of our neurologists. Thank you.     CC:  [facility] provider: Dr. 04/2022 PCP: Alexander Stewart, Alexander Pigg, MD    I spent 57 minutes of face-to-face and non-face-to-face time with patient and wife.  This included previsit chart review including review of recent hospitalization, lab review, study review, order entry, electronic health record documentation, patient and wife education regarding recent stroke including etiology, secondary stroke prevention measures and importance of managing stroke risk factors, and answered all other questions to patient and wife's satisfaction   Dorma Russell, AGNP-BC  [facility] [address] [place], [place]  Phone [phone] Fax [phone] Note: This  document was prepared with digital dictation and possible smart phrase technology. Any transcriptional errors that result from this process are unintentional.

## 2022-05-21 ENCOUNTER — Telehealth: Payer: Self-pay | Admitting: Internal Medicine

## 2022-05-21 NOTE — Telephone Encounter (Signed)
Wife was returning call. Pleas advise

## 2022-05-21 NOTE — Telephone Encounter (Signed)
Called patient- advised that they had cardiac event monitor- they are unsure if it was placed for a 30 day event. They placed it on 10/01-  Appointment with MD scheduled for 11/01. Advised to keep appointment for now, I would reach out to MD to see what she recommends.   Thanks!

## 2022-05-21 NOTE — Telephone Encounter (Signed)
Wife called wanting to know if the patient should come in with his heart monitor to his appointment on 11/1.

## 2022-05-21 NOTE — Telephone Encounter (Signed)
Called, LVM to call back. Left call back number.   

## 2022-05-24 ENCOUNTER — Ambulatory Visit: Payer: Medicare Other | Admitting: Adult Health

## 2022-05-24 ENCOUNTER — Encounter: Payer: Self-pay | Admitting: Adult Health

## 2022-05-24 VITALS — BP 121/75 | HR 58 | Ht 67.0 in | Wt 155.0 lb

## 2022-05-24 DIAGNOSIS — I639 Cerebral infarction, unspecified: Secondary | ICD-10-CM | POA: Diagnosis not present

## 2022-05-24 DIAGNOSIS — Z09 Encounter for follow-up examination after completed treatment for conditions other than malignant neoplasm: Secondary | ICD-10-CM | POA: Diagnosis not present

## 2022-05-24 NOTE — Patient Instructions (Addendum)
Continue aspirin 81 mg daily and Brilinta (ticagrelor) 90 mg bid  and Crestor  for secondary stroke prevention and post right ICA stent - please ensure follow up with interventional radiology Dr. Norma Fredrickson 3 months post procedure for follow-up imaging and to discuss ongoing duration of both aspirin and Brilinta duration  Complete cardiac monitor to rule out atrial fibrillation  Continue to follow up with PCP regarding cholesterol and blood pressure management  Maintain strict control of hypertension with blood pressure goal below 130/90 and cholesterol with LDL cholesterol (bad cholesterol) goal below 70 mg/dL.   Signs of a Stroke? Follow the BEFAST method:  Balance Watch for a sudden loss of balance, trouble with coordination or vertigo Eyes Is there a sudden loss of vision in one or both eyes? Or double vision?  Face: Ask the person to smile. Does one side of the face droop or is it numb?  Arms: Ask the person to raise both arms. Does one arm drift downward? Is there weakness or numbness of a leg? Speech: Ask the person to repeat a simple phrase. Does the speech sound slurred/strange? Is the person confused ? Time: If you observe any of these signs, call 911.       Thank you for coming to see [place] at [facility]. I hope we have been able to provide you high quality care today.  You may receive a patient satisfaction survey over the next few weeks. We would appreciate your feedback and comments so that we may continue to improve ourselves and the health of our patients.    Stroke Prevention Some medical conditions and lifestyle choices can lead to a higher risk for a stroke. You can help to prevent a stroke by eating healthy foods and exercising. It also helps to not smoke and to manage any health problems you may have. How can this condition affect me? A stroke is an emergency. It should be treated right away. A stroke can lead to brain damage or threaten your life.  There is a better chance of surviving and getting better after a stroke if you get medical help right away. What can increase my risk? The following medical conditions may increase your risk of a stroke: Diseases of the heart and blood vessels (cardiovascular disease). High blood pressure (hypertension). Diabetes. High cholesterol. Sickle cell disease. Problems with blood clotting. Being very overweight. Sleeping problems (obstructivesleep apnea). Other risk factors include: Being older than age 67. A history of blood clots, stroke, or mini-stroke (TIA). Race, ethnic background, or a family history of stroke. Smoking or using tobacco products. Taking birth control pills, especially if you smoke. Heavy alcohol and drug use. Not being active. What actions can I take to prevent this? Manage your health conditions High cholesterol. Eat a healthy diet. If this is not enough to manage your cholesterol, you may need to take medicines. Take medicines as told by your doctor. High blood pressure. Try to keep your blood pressure below 130/80. If your blood pressure cannot be managed through a healthy diet and regular exercise, you may need to take medicines. Take medicines as told by your doctor. Ask your doctor if you should check your blood pressure at home. Have your blood pressure checked every year. Diabetes. Eat a healthy diet and get regular exercise. If your blood sugar (glucose) cannot be managed through diet and exercise, you may need to take medicines. Take medicines as told by your doctor. Talk to your doctor about getting checked for  sleeping problems. Signs of a problem can include: Snoring a lot. Feeling very tired. Make sure that you manage any other conditions you have. Nutrition  Follow instructions from your doctor about what to eat or drink. You may be told to: Eat and drink fewer calories each day. Limit how much salt (sodium) you use to 1,500 milligrams (mg) each  day. Use only healthy fats for cooking, such as olive oil, canola oil, and sunflower oil. Eat healthy foods. To do this: Choose foods that are high in fiber. These include whole grains, and fresh fruits and vegetables. Eat at least 5 servings of fruits and vegetables a day. Try to fill one-half of your plate with fruits and vegetables at each meal. Choose low-fat (lean) proteins. These include low-fat cuts of meat, chicken without skin, fish, tofu, beans, and nuts. Eat low-fat dairy products. Avoid foods that: Are high in salt. Have saturated fat. Have trans fat. Have cholesterol. Are processed or pre-made. Count how many carbohydrates you eat and drink each day. Lifestyle If you drink alcohol: Limit how much you have to: 0-1 drink a day for women who are not pregnant. 0-2 drinks a day for men. Know how much alcohol is in your drink. In the [place] one drink equals one 12 oz bottle of beer (342mL), one 5 oz glass of wine (141mL), or one 1 oz glass of hard liquor (27mL). Do not smoke or use any products that have nicotine or tobacco. If you need help quitting, ask your doctor. Avoid secondhand smoke. Do not use drugs. Activity  Try to stay at a healthy weight. Get at least 30 minutes of exercise on most days, such as: Fast walking. Biking. Swimming. Medicines Take over-the-counter and prescription medicines only as told by your doctor. Avoid taking birth control pills. Talk to your doctor about the risks of taking birth control pills if: You are over 32 years old. You smoke. You get very bad headaches. You have had a blood clot. Where to find more information American Stroke Association: www.strokeassociation.org Get help right away if: You or a loved one has any signs of a stroke. "BE FAST" is an easy way to remember the warning signs: B - Balance. Dizziness, sudden trouble walking, or loss of balance. E - Eyes. Trouble seeing or a change in how you see. F - Face. Sudden  weakness or loss of feeling of the face. The face or eyelid may droop on one side. A - Arms. Weakness or loss of feeling in an arm. This happens all of a sudden and most often on one side of the body. S - Speech. Sudden trouble speaking, slurred speech, or trouble understanding what people say. T - Time. Time to call emergency services. Write down what time symptoms started. You or a loved one has other signs of a stroke, such as: A sudden, very bad headache with no known cause. Feeling like you may vomit (nausea). Vomiting. A seizure. These symptoms may be an emergency. Get help right away. Call your local emergency services (911 in the [place] Do not wait to see if the symptoms will go away. Do not drive yourself to the hospital. Summary You can help to prevent a stroke by eating healthy, exercising, and not smoking. It also helps to manage any health problems you have. Do not smoke or use any products that contain nicotine or tobacco. Get help right away if you or a loved one has any signs of a stroke. This information is  not intended to replace advice given to you by your health care provider. Make sure you discuss any questions you have with your health care provider. Document Revised: 02/11/2020 Document Reviewed: 02/11/2020 Elsevier Patient Education  [place].

## 2022-05-26 ENCOUNTER — Ambulatory Visit: Payer: Medicare Other | Attending: Internal Medicine | Admitting: Internal Medicine

## 2022-05-26 ENCOUNTER — Encounter: Payer: Self-pay | Admitting: Internal Medicine

## 2022-05-26 VITALS — BP 128/78 | HR 60 | Ht 67.0 in | Wt 155.0 lb

## 2022-05-26 DIAGNOSIS — I639 Cerebral infarction, unspecified: Secondary | ICD-10-CM

## 2022-05-26 NOTE — Patient Instructions (Signed)

## 2022-05-26 NOTE — Progress Notes (Signed)
Cardiology Office Note:    Date:  05/26/2022   ID:  Alexander Stewart, DOB 12-14-1945, MRN [id number]  PCP:  Randel Pigg, Dorma Russell, MD   [facility] Providers Cardiologist:  Maisie Fus, MD     Referring MD: Randel Pigg, Dorma Russell, MD   No chief complaint on file. Stroke  History of Present Illness:    Alexander Stewart is a 76 y.o. male with a hx of HTN, 30 pack year smoking quit 1998,  R MCA stroke, s/p TNK , s/p R ICA stent 04/12/2022. Saw Dr. Sampson Goon from [facility]. He had an ILR place in 2015. He fell off the roof. This was for a syncope work up in 2016. He wore for 8 years. This was removed. No arrhythmia. Last noted 09/24/2020. Blood pressure is well controlled. He denies palpitations or syncope  Cardiac monitor still wearing.   TTE 04/12/2022: EF 60-65%, normal RV fxn, no significant valve abnormalities. LA index nl   Past Medical History:  Diagnosis Date   Emphysema lung (HCC)    Hyperlipidemia    Hypertension    Stroke [facility])    Thyroid disease     Past Surgical History:  Procedure Laterality Date   CAROTID ENDARTERECTOMY  04/12/2022   Stent inserted.   IR CT HEAD LTD  04/12/2022   IR CT HEAD LTD  04/12/2022   IR INTRA CRAN STENT  04/12/2022   IR INTRAVSC STENT CERV CAROTID W/EMB-PROT MOD SED INCL ANGIO  04/12/2022   IR PERCUTANEOUS ART THROMBECTOMY/INFUSION INTRACRANIAL INC DIAG ANGIO  04/12/2022   IR US GUIDE VASC ACCESS RIGHT  04/12/2022   RADIOLOGY WITH ANESTHESIA N/A 04/12/2022   Procedure: IR WITH ANESTHESIA;  Surgeon: Radiologist, Medication, MD;  Location: [facility] OR;  Service: Radiology;  Laterality: N/A;    Current Medications: Current Meds  Medication Sig   aspirin EC 81 MG tablet Take 1 tablet (81 mg total) by mouth daily. Swallow whole.   Cholecalciferol (VITAMIN D3 PO) Take 1 capsule by mouth daily.   metoprolol succinate (TOPROL-XL) 25 MG 24 hr tablet Take 25 mg by mouth daily.   Multiple Vitamin (MULTIVITAMIN WITH MINERALS) TABS tablet  Take 1 tablet by mouth daily.   rosuvastatin (CRESTOR) 20 MG tablet Take 1 tablet (20 mg total) by mouth daily.   ticagrelor (BRILINTA) 90 MG TABS tablet Take 1 tablet (90 mg total) by mouth 2 (two) times daily.   vitamin B-12 (CYANOCOBALAMIN) 100 MCG tablet Take 100 mcg by mouth daily.   vitamin E 1000 UNIT capsule Take 1,000 Units by mouth daily.     Allergies:   Patient has no known allergies.   Social History   Socioeconomic History   Marital status: Married    Spouse name: Not on file   Number of children: Not on file   Years of education: Not on file   Highest education level: Not on file  Occupational History   Not on file  Tobacco Use   Smoking status: Former   Smokeless tobacco: Never  Substance and Sexual Activity   Alcohol use: Yes    Alcohol/week: 14.0 standard drinks of alcohol    Types: 14 Glasses of wine per week   Drug use: Never   Sexual activity: Not on file  Other Topics Concern   Not on file  Social History Narrative   Not on file   Social Determinants of Health   Financial Resource Strain: Not on file  Food Insecurity: No Food Insecurity (04/14/2022)  Hunger Vital Sign    Worried About Running Out of Food in the Last Year: Never true    Ran Out of Food in the Last Year: Never true  Transportation Needs: No Transportation Needs (04/14/2022)   PRAPARE - Hydrologist (Medical): No    Lack of Transportation (Non-Medical): No  Physical Activity: Not on file  Stress: Not on file  Social Connections: Not on file     Family History: Mother- Stroke  ROS:   Please see the history of present illness.     All other systems reviewed and are negative.  EKGs/Labs/Other Studies Reviewed:    The following studies were reviewed today:   EKG:  EKG is  ordered today.  The ekg ordered today demonstrates   11/1- NSR  Recent Labs: 04/12/2022: ALT 23; TSH 10.849 04/14/2022: BUN 15; Creatinine, Ser 1.10; Hemoglobin 11.1;  Platelets 150; Potassium 3.8; Sodium 140   Recent Lipid Panel    Component Value Date/Time   CHOL 130 04/13/2022 0201   TRIG 74 04/13/2022 0201   HDL 39 (L) 04/13/2022 0201   CHOLHDL 3.3 04/13/2022 0201   VLDL 15 04/13/2022 0201   LDLCALC 76 04/13/2022 0201     Risk Assessment/Calculations:     Physical Exam:    VS:   Vitals:   05/26/22 1059  BP: 128/78  Pulse: 60  SpO2: 97%    Wt Readings from Last 3 Encounters:  05/26/22 155 lb (70.3 kg)  05/24/22 155 lb (70.3 kg)  04/12/22 160 lb 0.9 oz (72.6 kg)     GEN:  Well nourished, well developed in no acute distress HEENT: Normal NECK: No JVD;  LYMPHATICS: No lymphadenopathy CARDIAC: RRR, no murmurs, rubs, gallops RESPIRATORY:  Clear to auscultation without rales, wheezing or rhonchi  ABDOMEN: Soft, non-tender, non-distended MUSCULOSKELETAL:  No edema; No deformity  SKIN: Warm and dry NEUROLOGIC:  Alert and oriented x 3 PSYCHIATRIC:  Normal affect   ASSESSMENT:    S/p stroke: s/p R ICA stent : no hx of afib with longstanding ILR. He is pending ziopatch results. We evaluate for afib - ziopatch  - on DAPT post stroke  - LDL 76 on crestor 20 mg daily - A1c 5.6 PLAN:    In order of problems listed above:  Follow up pending ziopatch results      Medication Adjustments/Labs and Tests Ordered: Current medicines are reviewed at length with the patient today.  Concerns regarding medicines are outlined above.  Orders Placed This Encounter  Procedures   EKG 12-Lead   No orders of the defined types were placed in this encounter.   Patient Instructions  Medication Instructions:  No Changes In Medications at this time.  *If you need a refill on your cardiac medications before your next appointment, please call your pharmacy*  Lab Work: None Ordered At This Time.  If you have labs (blood work) drawn today and your tests are completely normal, you will receive your results only by: [place] (if you have  MyChart) OR A paper copy in the mail If you have any lab test that is abnormal or we need to change your treatment, we will call you to review the results.  Testing/Procedures: None Ordered At This Time.   Follow-Up: At [facility], you and your health needs are our priority.  As part of our continuing mission to provide you with exceptional heart care, we have created designated Provider Care Teams.  These Care Teams  include your primary Cardiologist (physician) and Advanced Practice Providers (APPs -  Physician Assistants and Nurse Practitioners) who all work together to provide you with the care you need, when you need it.  Your next appointment:   AS NEEDED   The format for your next appointment:   In Person  Provider:   Maisie Fus, MD            Signed, Maisie Fus, MD  05/26/2022 1:33 PM     [facility]

## 2022-06-03 ENCOUNTER — Other Ambulatory Visit: Payer: Self-pay | Admitting: Cardiology

## 2022-06-03 DIAGNOSIS — I4891 Unspecified atrial fibrillation: Secondary | ICD-10-CM

## 2022-06-03 DIAGNOSIS — I639 Cerebral infarction, unspecified: Secondary | ICD-10-CM

## 2022-07-21 ENCOUNTER — Other Ambulatory Visit: Payer: Self-pay

## 2022-07-21 NOTE — Patient Outreach (Signed)
Telephone outreach to patient to obtain mRS was successfully completed. MRS= 0  Alexander Stewart [facility] Care Management Assistant [phone]  

## 2022-07-28 ENCOUNTER — Other Ambulatory Visit (HOSPITAL_COMMUNITY): Payer: Self-pay | Admitting: Neuroradiology

## 2022-07-28 ENCOUNTER — Telehealth (HOSPITAL_COMMUNITY): Payer: Self-pay

## 2022-07-28 ENCOUNTER — Other Ambulatory Visit (HOSPITAL_COMMUNITY): Payer: Self-pay | Admitting: Student

## 2022-07-28 DIAGNOSIS — I639 Cerebral infarction, unspecified: Secondary | ICD-10-CM

## 2022-07-28 NOTE — Telephone Encounter (Signed)
Called to schedule mra/ultrasound, no answer, no vm. AW
